# Patient Record
Sex: Female | Born: 1995 | Race: Black or African American | Hispanic: No | Marital: Single | Smoking: Never smoker
Health system: Southern US, Community
[De-identification: ages and names within clinical notes are randomized; demographics above are authoritative.]

## PROBLEM LIST (undated history)

## (undated) DIAGNOSIS — F32A Depression, unspecified: Secondary | ICD-10-CM

## (undated) DIAGNOSIS — F419 Anxiety disorder, unspecified: Secondary | ICD-10-CM

## (undated) HISTORY — PX: WISDOM TOOTH EXTRACTION: SHX21

---
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
## 2017-12-14 ENCOUNTER — Encounter: Payer: Self-pay | Admitting: Podiatry

## 2017-12-14 NOTE — Patient Instructions (Signed)

## 2017-12-20 NOTE — Progress Notes (Signed)
This encounter was created in error - please disregard.

## 2017-12-27 ENCOUNTER — Ambulatory Visit (INDEPENDENT_AMBULATORY_CARE_PROVIDER_SITE_OTHER): Payer: BLUE CROSS/BLUE SHIELD | Admitting: Podiatry

## 2017-12-27 ENCOUNTER — Encounter: Payer: Self-pay | Admitting: Podiatry

## 2017-12-27 ENCOUNTER — Ambulatory Visit (INDEPENDENT_AMBULATORY_CARE_PROVIDER_SITE_OTHER): Payer: BLUE CROSS/BLUE SHIELD

## 2017-12-27 VITALS — BP 108/72 | HR 95 | Resp 16

## 2017-12-27 DIAGNOSIS — M779 Enthesopathy, unspecified: Secondary | ICD-10-CM

## 2017-12-27 DIAGNOSIS — M2010 Hallux valgus (acquired), unspecified foot: Secondary | ICD-10-CM | POA: Diagnosis not present

## 2017-12-27 DIAGNOSIS — M2011 Hallux valgus (acquired), right foot: Secondary | ICD-10-CM | POA: Diagnosis not present

## 2017-12-27 DIAGNOSIS — M2012 Hallux valgus (acquired), left foot: Secondary | ICD-10-CM

## 2017-12-27 NOTE — Patient Instructions (Signed)

## 2017-12-27 NOTE — Progress Notes (Signed)
   Subjective:    Patient ID: Karen Meyers, female    DOB: Nov 30, 1995, 21 y.o.   MRN: [id number]  HPI    Review of Systems  All other systems reviewed and are negative.      Objective:   Physical Exam        Assessment & Plan:

## 2017-12-27 NOTE — Progress Notes (Signed)
Subjective:   Patient ID: Karen Meyers, female   DOB: 22 y.o.   MRN: [id number]   HPI patient presents stating she gets a lot of pain in her feet in general and she had a bunion deformity of her right over left foot that she was concerned about.  Presents with mother and patient does not smoke and likes to be active.  States she is had foot problems for a long time    Review of Systems  All other systems reviewed and are negative.       Objective:  Physical Exam  Constitutional: She appears well-developed and well-nourished.  Cardiovascular: Intact distal pulses.  Pulmonary/Chest: Effort normal.  Musculoskeletal: Normal range of motion.  Neurological: She is alert.  Skin: Skin is warm.  Nursing note and vitals reviewed.   Neurovascular status intact muscle strength is adequate range of motion within normal limits with patient found to have mild structural bunion deformity right with redness around the side moderate depression of the arch with inflammation around the posterior tibial tendon insertion bilateral.  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

## 2018-01-24 ENCOUNTER — Other Ambulatory Visit: Payer: BLUE CROSS/BLUE SHIELD | Admitting: Orthotics

## 2018-06-22 LAB — OB RESULTS CONSOLE RUBELLA ANTIBODY, IGM: Rubella: NON-IMMUNE/NOT IMMUNE

## 2018-06-22 LAB — OB RESULTS CONSOLE HIV ANTIBODY (ROUTINE TESTING): HIV: NONREACTIVE

## 2018-06-22 LAB — OB RESULTS CONSOLE GC/CHLAMYDIA
Chlamydia: NEGATIVE
Gonorrhea: NEGATIVE

## 2018-06-22 LAB — OB RESULTS CONSOLE HEPATITIS B SURFACE ANTIGEN: Hepatitis B Surface Ag: NEGATIVE

## 2018-08-01 NOTE — L&D Delivery Note (Signed)
Delivery Note At 6:55 PM a viable female was delivered via Vaginal, Spontaneous (Presentation: OA).  APGAR: 8, 9; weight  pending Placenta status: routine , .  Cord:  with the following complications: none.  Cord pH: not sent  Anesthesia:   Episiotomy: None Lacerations: Periurethral Suture Repair: 3.0 Est. Blood Loss (mL): 400   It's a girl - "[place]"!! Mom to postpartum.  Baby to Couplet care / Skin to Skin.  Tyson Dense 01/09/2019, 7:36 PM

## 2018-10-29 LAB — OB RESULTS CONSOLE HIV ANTIBODY (ROUTINE TESTING): HIV: NONREACTIVE

## 2018-12-31 LAB — OB RESULTS CONSOLE GBS: GBS: POSITIVE

## 2019-01-08 ENCOUNTER — Other Ambulatory Visit: Payer: Self-pay

## 2019-01-08 ENCOUNTER — Encounter (HOSPITAL_COMMUNITY): Payer: Self-pay

## 2019-01-08 ENCOUNTER — Inpatient Hospital Stay (EMERGENCY_DEPARTMENT_HOSPITAL)
Admission: AD | Admit: 2019-01-08 | Discharge: 2019-01-08 | Disposition: A | Discharge status: Home / Self Care | Payer: Medicaid Other | Source: Home / Self Care | Attending: Obstetrics and Gynecology | Admitting: Obstetrics and Gynecology

## 2019-01-08 DIAGNOSIS — Z3A37 37 weeks gestation of pregnancy: Secondary | ICD-10-CM

## 2019-01-08 DIAGNOSIS — O479 False labor, unspecified: Secondary | ICD-10-CM | POA: Diagnosis not present

## 2019-01-08 DIAGNOSIS — O471 False labor at or after 37 completed weeks of gestation: Secondary | ICD-10-CM | POA: Insufficient documentation

## 2019-01-08 LAB — OB RESULTS CONSOLE GC/CHLAMYDIA
Chlamydia: NEGATIVE
Gonorrhea: NEGATIVE

## 2019-01-08 NOTE — MAU Note (Signed)
I have communicated with Hansel Feinstein, CNM and reviewed vital signs:  Vitals:   01/08/19 1923 01/08/19 2115  BP: 132/71 131/71  Pulse: (!) 101 86  Resp: 19 19  Temp: 98.9 F (37.2 C)     Vaginal exam:  Dilation: 4 Effacement (%): 60 Cervical Position: Middle Station: -3 Presentation: Vertex Exam by:: Esau Grew, RN,   Also reviewed contraction pattern and that non-stress test is reactive.  It has been documented that patient is contracting every 2-5 minutes with no cervical change over 1.5 hours not indicating active labor.  Patient denies any other complaints.  Based on this report provider has given order for discharge.  A discharge order and diagnosis entered by a provider.   Labor discharge instructions reviewed with patient.

## 2019-01-08 NOTE — Discharge Instructions (Signed)

## 2019-01-08 NOTE — MAU Provider Note (Signed)
S: Ms. Karen Meyers is a 23 y.o. G1P0 at [redacted]w[redacted]d  who presents to MAU today for labor evaluation.     Cervical exam by RN:  Dilation: 4 Effacement (%): 60 Cervical Position: Middle Station: -3 Presentation: Vertex Exam by:: Esau Grew, RN  Fetal Monitoring: Baseline: 140 Variability: average Accelerations: present Decelerations: absent Contractions: irregular  MDM Discussed patient with RN. NST reviewed.   A: SIUP at [redacted]w[redacted]d  False labor  P: Discharge home Labor precautions and kick counts included in AVS Patient to follow-up with Dr Julien Girt as scheduled  Patient may return to MAU as needed or when in labor   Seabron Spates, CNM 01/08/2019 9:05 PM

## 2019-01-08 NOTE — MAU Note (Addendum)
CTX every 2 minutes.  No LOF.  Some bloody show.  Was 4.5 cm today in the office.  No complications w/ pregnancy.  + FM.  GBS +

## 2019-01-09 ENCOUNTER — Encounter (HOSPITAL_COMMUNITY): Payer: Self-pay

## 2019-01-09 ENCOUNTER — Other Ambulatory Visit: Payer: Self-pay

## 2019-01-09 ENCOUNTER — Inpatient Hospital Stay (HOSPITAL_COMMUNITY): Payer: Medicaid Other | Admitting: Anesthesiology

## 2019-01-09 ENCOUNTER — Inpatient Hospital Stay (HOSPITAL_COMMUNITY)
Admission: AD | Admit: 2019-01-09 | Discharge: 2019-01-11 | DRG: 807 | Disposition: A | Discharge status: Home / Self Care | Payer: Medicaid Other | Attending: Obstetrics and Gynecology | Admitting: Obstetrics and Gynecology

## 2019-01-09 DIAGNOSIS — O99824 Streptococcus B carrier state complicating childbirth: Secondary | ICD-10-CM | POA: Diagnosis present

## 2019-01-09 DIAGNOSIS — Z3A37 37 weeks gestation of pregnancy: Secondary | ICD-10-CM

## 2019-01-09 DIAGNOSIS — Z1159 Encounter for screening for other viral diseases: Secondary | ICD-10-CM

## 2019-01-09 DIAGNOSIS — O26893 Other specified pregnancy related conditions, third trimester: Secondary | ICD-10-CM | POA: Diagnosis present

## 2019-01-09 LAB — CBC
HCT: 36.3 % (ref 36.0–46.0)
Hemoglobin: 11.6 g/dL — ABNORMAL LOW (ref 12.0–15.0)
MCH: 26.1 pg (ref 26.0–34.0)
MCHC: 32 g/dL (ref 30.0–36.0)
MCV: 81.6 fL (ref 80.0–100.0)
Platelets: 244 10*3/uL (ref 150–400)
RBC: 4.45 MIL/uL (ref 3.87–5.11)
RDW: 13.5 % (ref 11.5–15.5)
WBC: 13.8 10*3/uL — ABNORMAL HIGH (ref 4.0–10.5)
nRBC: 0 % (ref 0.0–0.2)

## 2019-01-09 LAB — TYPE AND SCREEN
ABO/RH(D): B POS
Antibody Screen: NEGATIVE

## 2019-01-09 LAB — SARS CORONAVIRUS 2: SARS Coronavirus 2: NOT DETECTED

## 2019-01-09 MED ORDER — IBUPROFEN 600 MG PO TABS
600.0000 mg | ORAL_TABLET | Freq: Four times a day (QID) | ORAL | Status: DC
  Administered 2019-01-10 – 2019-01-11 (×7): 600 mg via ORAL
  Filled 2019-01-09 (×7): qty 1

## 2019-01-09 MED ORDER — COCONUT OIL OIL: 1.0000 "application " | TOPICAL_OIL | Status: DC | PRN

## 2019-01-09 MED ORDER — CEFAZOLIN SODIUM-DEXTROSE 1-4 GM/50ML-% IV SOLN
1.0000 g | Freq: Three times a day (TID) | INTRAVENOUS | Status: DC
  Filled 2019-01-09: qty 50

## 2019-01-09 MED ORDER — OXYCODONE HCL 5 MG PO TABS: 10.0000 mg | ORAL_TABLET | ORAL | Status: DC | PRN

## 2019-01-09 MED ORDER — PENICILLIN G 3 MILLION UNITS IVPB - SIMPLE MED: 3.0000 10*6.[IU] | INTRAVENOUS | Status: DC

## 2019-01-09 MED ORDER — ZOLPIDEM TARTRATE 5 MG PO TABS: 5.0000 mg | ORAL_TABLET | Freq: Every evening | ORAL | Status: DC | PRN

## 2019-01-09 MED ORDER — OXYTOCIN 40 UNITS IN NORMAL SALINE INFUSION - SIMPLE MED
2.5000 [IU]/h | INTRAVENOUS | Status: DC
  Filled 2019-01-09: qty 1000

## 2019-01-09 MED ORDER — LACTATED RINGERS IV SOLN: 500.0000 mL | Freq: Once | INTRAVENOUS | Status: DC

## 2019-01-09 MED ORDER — PHENYLEPHRINE 40 MCG/ML (10ML) SYRINGE FOR IV PUSH (FOR BLOOD PRESSURE SUPPORT): 80.0000 ug | PREFILLED_SYRINGE | INTRAVENOUS | Status: DC | PRN

## 2019-01-09 MED ORDER — TETANUS-DIPHTH-ACELL PERTUSSIS 5-2.5-18.5 LF-MCG/0.5 IM SUSP: 0.5000 mL | Freq: Once | INTRAMUSCULAR | Status: DC

## 2019-01-09 MED ORDER — WITCH HAZEL-GLYCERIN EX PADS: 1.0000 "application " | MEDICATED_PAD | CUTANEOUS | Status: DC | PRN

## 2019-01-09 MED ORDER — ONDANSETRON HCL 4 MG/2ML IJ SOLN: 4.0000 mg | INTRAMUSCULAR | Status: DC | PRN

## 2019-01-09 MED ORDER — DIBUCAINE (PERIANAL) 1 % EX OINT: 1.0000 "application " | TOPICAL_OINTMENT | CUTANEOUS | Status: DC | PRN

## 2019-01-09 MED ORDER — DIPHENHYDRAMINE HCL 25 MG PO CAPS: 25.0000 mg | ORAL_CAPSULE | Freq: Four times a day (QID) | ORAL | Status: DC | PRN

## 2019-01-09 MED ORDER — BUTORPHANOL TARTRATE 1 MG/ML IJ SOLN
1.0000 mg | Freq: Once | INTRAMUSCULAR | Status: AC
  Administered 2019-01-09: 1 mg via INTRAVENOUS
  Filled 2019-01-09: qty 1

## 2019-01-09 MED ORDER — FLEET ENEMA 7-19 GM/118ML RE ENEM: 1.0000 | ENEMA | RECTAL | Status: DC | PRN

## 2019-01-09 MED ORDER — OXYCODONE-ACETAMINOPHEN 5-325 MG PO TABS: 2.0000 | ORAL_TABLET | ORAL | Status: DC | PRN

## 2019-01-09 MED ORDER — ACETAMINOPHEN 325 MG PO TABS: 650.0000 mg | ORAL_TABLET | ORAL | Status: DC | PRN

## 2019-01-09 MED ORDER — SODIUM CHLORIDE 0.9 % IV SOLN
2.0000 g | Freq: Once | INTRAVENOUS | Status: AC
  Administered 2019-01-09: 2 g via INTRAVENOUS
  Filled 2019-01-09: qty 2000

## 2019-01-09 MED ORDER — FENTANYL-BUPIVACAINE-NACL 0.5-0.125-0.9 MG/250ML-% EP SOLN
12.0000 mL/h | EPIDURAL | Status: DC | PRN
  Filled 2019-01-09: qty 250

## 2019-01-09 MED ORDER — ONDANSETRON HCL 4 MG PO TABS: 4.0000 mg | ORAL_TABLET | ORAL | Status: DC | PRN

## 2019-01-09 MED ORDER — CEFAZOLIN SODIUM-DEXTROSE 2-4 GM/100ML-% IV SOLN
2.0000 g | Freq: Once | INTRAVENOUS | Status: AC
  Administered 2019-01-09: 16:00:00 2 g via INTRAVENOUS
  Filled 2019-01-09: qty 100

## 2019-01-09 MED ORDER — ONDANSETRON HCL 4 MG/2ML IJ SOLN
4.0000 mg | Freq: Four times a day (QID) | INTRAMUSCULAR | Status: DC | PRN
  Administered 2019-01-09: 4 mg via INTRAVENOUS
  Filled 2019-01-09: qty 2

## 2019-01-09 MED ORDER — SODIUM CHLORIDE 0.9 % IV SOLN
5.0000 10*6.[IU] | Freq: Once | INTRAVENOUS | Status: DC
  Filled 2019-01-09: qty 5

## 2019-01-09 MED ORDER — LACTATED RINGERS IV SOLN
INTRAVENOUS | Status: DC
  Administered 2019-01-09 (×3): via INTRAVENOUS

## 2019-01-09 MED ORDER — SIMETHICONE 80 MG PO CHEW: 80.0000 mg | CHEWABLE_TABLET | ORAL | Status: DC | PRN

## 2019-01-09 MED ORDER — EPHEDRINE 5 MG/ML INJ: 10.0000 mg | INTRAVENOUS | Status: DC | PRN

## 2019-01-09 MED ORDER — SOD CITRATE-CITRIC ACID 500-334 MG/5ML PO SOLN: 30.0000 mL | ORAL | Status: DC | PRN

## 2019-01-09 MED ORDER — BENZOCAINE-MENTHOL 20-0.5 % EX AERO: 1.0000 "application " | INHALATION_SPRAY | CUTANEOUS | Status: DC | PRN

## 2019-01-09 MED ORDER — LACTATED RINGERS IV SOLN: 500.0000 mL | INTRAVENOUS | Status: DC | PRN

## 2019-01-09 MED ORDER — OXYTOCIN BOLUS FROM INFUSION
500.0000 mL | Freq: Once | INTRAVENOUS | Status: AC
  Administered 2019-01-09: 500 mL via INTRAVENOUS

## 2019-01-09 MED ORDER — LIDOCAINE HCL (PF) 1 % IJ SOLN: 30.0000 mL | INTRAMUSCULAR | Status: DC | PRN

## 2019-01-09 MED ORDER — PRENATAL MULTIVITAMIN CH
1.0000 | ORAL_TABLET | Freq: Every day | ORAL | Status: DC
  Administered 2019-01-10 – 2019-01-11 (×2): 1 via ORAL
  Filled 2019-01-09 (×2): qty 1

## 2019-01-09 MED ORDER — SENNOSIDES-DOCUSATE SODIUM 8.6-50 MG PO TABS
2.0000 | ORAL_TABLET | ORAL | Status: DC
  Administered 2019-01-10 – 2019-01-11 (×2): 2 via ORAL
  Filled 2019-01-09 (×2): qty 2

## 2019-01-09 MED ORDER — DIPHENHYDRAMINE HCL 50 MG/ML IJ SOLN: 12.5000 mg | INTRAMUSCULAR | Status: DC | PRN

## 2019-01-09 MED ORDER — OXYCODONE HCL 5 MG PO TABS: 5.0000 mg | ORAL_TABLET | ORAL | Status: DC | PRN

## 2019-01-09 MED ORDER — LIDOCAINE HCL (PF) 1 % IJ SOLN
INTRAMUSCULAR | Status: DC | PRN
  Administered 2019-01-09 (×2): 5 mL via EPIDURAL

## 2019-01-09 MED ORDER — OXYCODONE-ACETAMINOPHEN 5-325 MG PO TABS: 1.0000 | ORAL_TABLET | ORAL | Status: DC | PRN

## 2019-01-09 MED ORDER — SODIUM CHLORIDE (PF) 0.9 % IJ SOLN
INTRAMUSCULAR | Status: DC | PRN
  Administered 2019-01-09: 14 mL/h via EPIDURAL

## 2019-01-09 NOTE — H&P (Signed)
Karen Meyers is a 23 y.o. female presenting for UCs. Denies ROM. PNC uncomplicated. OB History    Gravida  1   Para      Term      Preterm      AB      Living        SAB      TAB      Ectopic      Multiple      Live Births             History reviewed. No pertinent past medical history. Past Surgical History:  Procedure Laterality Date  . WISDOM TOOTH EXTRACTION     Family History: family history is not on file. Social History:  reports that she has never smoked. She has never used smokeless tobacco. She reports previous alcohol use. She reports that she does not use drugs.     Maternal Diabetes: No Genetic Screening: Normal Maternal Ultrasounds/Referrals: Normal Fetal Ultrasounds or other Referrals:  None Maternal Substance Abuse:  No Significant Maternal Medications:  None Significant Maternal Lab Results:  None Other Comments:  None  Review of Systems  Eyes: Negative for blurred vision.  Gastrointestinal: Negative for abdominal pain.  Neurological: Negative for headaches.   Maternal Medical History:  Fetal activity: Perceived fetal activity is normal.      Dilation: 7 Effacement (%): 80 Station: -2, -3 Exam by:: Krystal Eaton, RN Blood pressure 118/80, pulse 91, temperature 98.3 F (36.8 C), temperature source Oral, resp. rate 18, height 5\' 5"  (1.651 m), weight 102.1 kg, SpO2 100 %.   Fetal Exam Fetal State Assessment: Category I - tracings are normal.     Physical Exam  Cardiovascular: Normal rate.  Respiratory: Effort normal.    Prenatal labs: ABO, Rh: --/--/B POS (06/10 1520) Antibody: NEG (06/10 1520) Rubella:   RPR:    HBsAg:    HIV: Non-reactive (03/30 0000)  GBS: Positive (06/01 0000)   Assessment/Plan: 23 yo G1P0 in active labor Ampicillin for GBBS prophylaxis ordered Awaiting epidural   Shon Millet II 01/09/2019, 4:24 PM

## 2019-01-09 NOTE — MAU Note (Signed)
Pt presents to MAU with c/o ctx that started yesterday, have increased in intensity. Pt denies VB and LOF. +FM

## 2019-01-09 NOTE — Anesthesia Preprocedure Evaluation (Signed)
Anesthesia Evaluation  Patient identified by MRN, date of birth, ID band Patient awake    Reviewed: Allergy & Precautions, H&P , NPO status , Patient's Chart, lab work & pertinent test results  History of Anesthesia Complications Negative for: history of anesthetic complications  Airway Mallampati: II  TM Distance: >3 FB Neck ROM: full    Dental no notable dental hx. (+) Teeth Intact   Pulmonary neg pulmonary ROS,    Pulmonary exam normal breath sounds clear to auscultation       Cardiovascular negative cardio ROS Normal cardiovascular exam Rhythm:regular Rate:Normal     Neuro/Psych negative neurological ROS  negative psych ROS   GI/Hepatic negative GI ROS, Neg liver ROS,   Endo/Other  negative endocrine ROS  Renal/GU negative Renal ROS  negative genitourinary   Musculoskeletal   Abdominal (+) + obese,   Peds  Hematology negative hematology ROS (+)   Anesthesia Other Findings   Reproductive/Obstetrics (+) Pregnancy                             Anesthesia Physical Anesthesia Plan  ASA: II  Anesthesia Plan: Epidural   Post-op Pain Management:    Induction:   PONV Risk Score and Plan:   Airway Management Planned:   Additional Equipment:   Intra-op Plan:   Post-operative Plan:   Informed Consent: I have reviewed the patients History and Physical, chart, labs and discussed the procedure including the risks, benefits and alternatives for the proposed anesthesia with the patient or authorized representative who has indicated his/her understanding and acceptance.       Plan Discussed with:   Anesthesia Plan Comments:         Anesthesia Quick Evaluation  

## 2019-01-09 NOTE — Progress Notes (Signed)
FHT cat one AROM > clear Cx 9/C/0/vtx

## 2019-01-09 NOTE — Anesthesia Procedure Notes (Signed)
Epidural Patient location during procedure: OB Start time: 01/09/2019 4:41 PM End time: 01/09/2019 4:51 PM  Staffing Anesthesiologist: Murvin Natal, MD Performed: anesthesiologist   Preanesthetic Checklist Completed: patient identified, site marked, pre-op evaluation, timeout performed, IV checked, risks and benefits discussed and monitors and equipment checked  Epidural Patient position: sitting Prep: DuraPrep Patient monitoring: heart rate, cardiac monitor, continuous pulse ox and blood pressure Approach: midline Location: L4-L5 Injection technique: LOR air  Needle:  Needle type: Tuohy  Needle gauge: 17 G Needle length: 9 cm Needle insertion depth: 8 cm Catheter type: closed end flexible Catheter size: 19 Gauge Catheter at skin depth: 13 cm Test dose: negative and Other  Assessment Events: blood not aspirated, injection not painful, no injection resistance and negative IV test  Additional Notes Informed consent obtained prior to proceeding including risk of failure, 1% risk of PDPH, risk of minor discomfort and bruising. Discussed alternatives to epidural analgesia and patient desires to proceed.  Timeout performed pre-procedure verifying patient name, procedure, and platelet count.  Patient tolerated procedure well. Reason for block:procedure for pain

## 2019-01-10 LAB — CBC
HCT: 30.8 % — ABNORMAL LOW (ref 36.0–46.0)
Hemoglobin: 9.9 g/dL — ABNORMAL LOW (ref 12.0–15.0)
MCH: 26.1 pg (ref 26.0–34.0)
MCHC: 32.1 g/dL (ref 30.0–36.0)
MCV: 81.1 fL (ref 80.0–100.0)
Platelets: 209 10*3/uL (ref 150–400)
RBC: 3.8 MIL/uL — ABNORMAL LOW (ref 3.87–5.11)
RDW: 13.6 % (ref 11.5–15.5)
WBC: 15.1 10*3/uL — ABNORMAL HIGH (ref 4.0–10.5)
nRBC: 0 % (ref 0.0–0.2)

## 2019-01-10 LAB — RPR: RPR Ser Ql: NONREACTIVE

## 2019-01-10 LAB — ABO/RH: ABO/RH(D): B POS

## 2019-01-10 NOTE — Lactation Note (Signed)
This note was copied from a baby's chart. Lactation Consultation Note  Patient Name: Karen Meyers [id number] Date: 01/10/2019 Reason for consult: Difficult latch;Follow-up assessment;Early term 37-38.6wks;1st time breastfeeding;NICU baby;Infant < 6lbs  P1 mother whose infant is now 40 hours old.  This is an ETI at 37+1 weeks weighing < 6 lbs.  Mother requested latch assistance.  Mother's breasts are large, soft and non tender and nipples are everted and short shafted.  Mother has not been wearing her breast shells.  Asked her to put her bra on after this feeding and to wear shells.  Mother verbalized understanding.    Mother is familiar with hand expression but was unable to obtain colostrum at this time.  Assisted baby to latch onto the right breast in the football hold but she was not interested in sucking.  Gentle stimulation provided but still no interest.  Mother removed her from the breast and I stimulated her to awaken more.  Second attempt made to latch and baby was able to latch and took a few sucks before stopping.  Removed her from the breast and she fell asleep.  Swaddled baby.  Mother will call for latch assistance at the next feeding if needed.  She will pump with the DEBP and feed back any EBM she obtains.  Father present.   Maternal Data Formula Feeding for Exclusion: Yes Reason for exclusion: Mother's choice to formula and breast feed on admission Has patient been taught Hand Expression?: Yes Does the patient have breastfeeding experience prior to this delivery?: No  Feeding Feeding Type: Breast Milk  LATCH Score Latch: Too sleepy or reluctant, no latch achieved, no sucking elicited.  Audible Swallowing: None  Type of Nipple: Everted at rest and after stimulation(short shafted; reminded to use breast shells)  Comfort (Breast/Nipple): Soft / non-tender  Hold (Positioning): Assistance needed to correctly position infant at breast and maintain latch.  LATCH  Score: 5  Interventions Interventions: Breast feeding basics reviewed;Assisted with latch;Skin to skin;Hand express;Breast compression;Adjust position;Shells;Position options;Support pillows  Lactation Tools Discussed/Used WIC Program: No   Consult Status Consult Status: Follow-up Date: 01/11/19 Follow-up type: In-patient    Little Ishikawa 01/10/2019, 4:13 PM

## 2019-01-10 NOTE — Anesthesia Postprocedure Evaluation (Signed)
Anesthesia Post Note  Patient: Karen Meyers  Procedure(s) Performed: AN AD HOC LABOR EPIDURAL     Patient location during evaluation: Mother Baby Anesthesia Type: Epidural Level of consciousness: awake, awake and alert and oriented Pain management: pain level controlled Vital Signs Assessment: post-procedure vital signs reviewed and stable Respiratory status: spontaneous breathing Cardiovascular status: blood pressure returned to baseline Postop Assessment: no headache, no backache, no apparent nausea or vomiting, adequate PO intake and able to ambulate Anesthetic complications: no    Last Vitals:  Vitals:   01/10/19 0200 01/10/19 0551  BP: 121/66 111/71  Pulse: 89 68  Resp: 18 18  Temp: 36.9 C 36.7 C  SpO2: 100% 100%    Last Pain:  Vitals:   01/10/19 0551  TempSrc: Oral  PainSc: 0-No pain   Pain Goal:                   Kathie Rhodes

## 2019-01-10 NOTE — Progress Notes (Signed)
Post Partum Day 1 Subjective: no complaints, up ad lib, voiding and tolerating PO  Objective: Blood pressure 111/71, pulse 68, temperature 98.1 F (36.7 C), temperature source Oral, resp. rate 18, height 5\' 5"  (1.651 m), weight 102.1 kg, SpO2 100 %, unknown if currently breastfeeding.  Physical Exam:  General: alert, cooperative and appears stated age Lochia: appropriate Uterine Fundus: firm Incision: healing well, no significant drainage, no dehiscence, no significant erythema DVT Evaluation: No evidence of DVT seen on physical exam. Negative Homan's sign. No cords or calf tenderness. No significant calf/ankle edema.  Recent Labs    01/09/19 1527 01/10/19 0509  HGB 11.6* 9.9*  HCT 36.3 30.8*    Assessment/Plan: Plan for discharge tomorrow   LOS: 1 day   Tyson Dense 01/10/2019, 9:41 AM

## 2019-01-10 NOTE — Lactation Note (Signed)
This note was copied from a baby's chart. Lactation Consultation Note Baby 81 hrs old. New mom states having difficulty latching., baby had rather sleep than eat. Newborn behavior, STS, I&O, feeding habits, feeding positioning, breast massage, milk storage, supply and demand discussed. Mom has large breast w/flat nipples. Compressible. Encouraged to stimulate to evert prior to latching. Hand pump given for pre-pumping. Shells given. Hand expression taught. Colostrum poured from mom's breast.  Attempted to latch baby w/o NS. Unable to latch. Fitted #20 NS. Demonstrated application. Baby fussing, mom wasn't properly paying attention to application of NS. Hand expressed 5 ml colostrum.  Inserted into NS w/curve tip syring. Baby latched and fed. Baby getting fussy at the breast. Suggested mom burp baby.  Mom shown how to use DEBP & how to disassemble, clean, & reassemble parts. Mom knows to pump q3h for 15-20 min. Mom wasn't paying undivided attention to pump instructions. Mom may need instructed again.  Encouraged mom to call for questions or assistance. Lactation brochure given. ' Patient Name: Karen Meyers [id number] Date: 01/10/2019 Reason for consult: Initial assessment;Infant < 6lbs;Early term 37-38.6wks;1st time breastfeeding   Maternal Data Has patient been taught Hand Expression?: Yes Does the patient have breastfeeding experience prior to this delivery?: No  Feeding Feeding Type: Breast Fed  LATCH Score Latch: Repeated attempts needed to sustain latch, nipple held in mouth throughout feeding, stimulation needed to elicit sucking reflex.  Audible Swallowing: Spontaneous and intermittent(w/NS)  Type of Nipple: Flat  Comfort (Breast/Nipple): Soft / non-tender  Hold (Positioning): Full assist, staff holds infant at breast  LATCH Score: 6  Interventions Interventions: Breast feeding basics reviewed;Adjust position;DEBP;Assisted with latch;Support pillows;Skin to  skin;Breast massage;Expressed milk;Position options;Hand express;Pre-pump if needed;Shells;Breast compression;Hand pump  Lactation Tools Discussed/Used Tools: Shells;Pump Shell Type: Inverted Breast pump type: Double-Electric Breast Pump;Manual WIC Program: No Pump Review: Setup, frequency, and cleaning;Milk Storage Initiated by:: Allayne Stack RN IBCLC Date initiated:: 01/10/19   Consult Status Consult Status: Follow-up Date: 01/10/19(in pm) Follow-up type: In-patient    Theodoro Kalata 01/10/2019, 3:49 AM

## 2019-01-10 NOTE — Lactation Note (Signed)
This note was copied from a baby's chart. Lactation Consultation Note  Patient Name: Karen Meyers [id number] Date: 01/10/2019 Reason for consult: Follow-up assessment;Difficult latch;Early term 37-38.6wks;1st time breastfeeding;NICU baby  P1 mother whose infant is now 41 hours old.  This is an ETI at 37+1 weeks weighing < 6 lbs.  Mother requested latch assistance.  By the time I was able to arrive mother had finished feeding baby.  Father was holding baby and she was sleeping.  Mother stated she thinks that this was a good feed.  Mother used the NS and saw residual in the NS tip upon completion of feeding.  She had finished approximately 15 minutes prior to my arrival.  When I noticed that mother had not pumped after using the NS I reminded her to immediately pump after every feeding for 15 minutes.  I reviewed the pump set up with her and suction requirements.  #24 flange size is appropriate at this time.  Mother will feed back any EBM she obtains with pumping.  Finger feeding reviewed.  Encouraged to continue feeding 8-12 times/24 hours or sooner if baby shows cues.  She will call for latch assistance as needed.     Maternal Data Formula Feeding for Exclusion: Yes Reason for exclusion: Mother's choice to formula and breast feed on admission Has patient been taught Hand Expression?: Yes Does the patient have breastfeeding experience prior to this delivery?: No  Feeding    LATCH Score Latch: Too sleepy or reluctant, no latch achieved, no sucking elicited.  Audible Swallowing: None  Type of Nipple: Everted at rest and after stimulation(short shafted; reminded to use breast shells)  Comfort (Breast/Nipple): Soft / non-tender  Hold (Positioning): Assistance needed to correctly position infant at breast and maintain latch.  LATCH Score: 5  Interventions Interventions: Breast feeding basics reviewed;Assisted with latch;Skin to skin;Hand express;Breast compression;Adjust  position;Shells;Position options;Support pillows  Lactation Tools Discussed/Used WIC Program: No Pump Review: Setup, frequency, and cleaning;Milk Storage(Reviewed)   Consult Status Consult Status: Follow-up Date: 01/11/19 Follow-up type: In-patient    Little Ishikawa 01/10/2019, 7:35 PM

## 2019-01-11 ENCOUNTER — Ambulatory Visit: Payer: Self-pay

## 2019-01-11 MED ORDER — MEASLES, MUMPS & RUBELLA VAC IJ SOLR
0.5000 mL | Freq: Once | INTRAMUSCULAR | Status: AC
  Administered 2019-01-11: 0.5 mL via SUBCUTANEOUS
  Filled 2019-01-11: qty 0.5

## 2019-01-11 NOTE — Lactation Note (Signed)
This note was copied from a baby's chart. Lactation Consultation Note  Patient Name: Karen Meyers [id number] Date: 01/11/2019 Reason for consult: Follow-up assessment;Primapara;1st time breastfeeding;Infant < 6lbs;Early term 37-38.6wks;Infant weight loss;Other (Comment)(5 % weight loss/ milk is in - see [place] note) Baby is 13 hours old , bilirubin - 10.0 at 34 hours, baby is feeding consistently, voids qs, only 2 stools and 1 smear.  As LC entered the room baby latched with depth, consistent feeding pattern with swallows. LC showed  Mom breast compressions and increased swallows noted. When baby released the nipple was slightly  Slanted even though mom mentioned she was comfortable with feeding.  Breast are warm and filling.  LC reviewed engorgement prevention and tx.  Per mom hasn't pumped with the DEBP. [place] asked her if she need a review of set and she responded no She understood it.  LC discussed the importance of the 1st 2 weeks of breast feeding and protecting her milk supply,  And if the baby is not latching the need to release down her breast so she stays comfortable.  LC recommended if the 1st breast is full , not engorged - breast massage , hand express, pre- pump  So the baby can latch deeper.  Reminded mom due to the baby being less than 6 pounds may only feed the 1st breast, and if not  Softened well needs to pump both breast with DEBP or hand pump. [place] reminded mom if she pumps off  Any milk, hand express, to save milk for next feeding and feed back to baby.  Per mom will have a DEBP [place] at  Home.   LC recommended and discussed mom considering F/U with LC O/P at [facility].  LC encouraged for mom to think about it over night and make the am LC aware to place request in Epic.      Maternal Data Has patient been taught Hand Expression?: Yes(LC reviewed)  Feeding Feeding Type: (baby latched with depth) Nipple Type: Slow - flow  LATCH Score Latch: (latched with depth  on the left breast / cross cradle)  Audible Swallowing: (multiple swallows noted / increased with breast compressions)  Type of Nipple: (nipple well rounded when baby released)  Comfort (Breast/Nipple): (per mom comfortable)  Hold (Positioning): (mom independent with latch)     Interventions Interventions: Breast feeding basics reviewed;Breast compression;Shells;Hand pump;DEBP  Lactation Tools Discussed/Used Tools: Pump;Shells Shell Type: Inverted Breast pump type: Double-Electric Breast Pump;Manual   Consult Status Consult Status: Follow-up Date: 01/12/19 Follow-up type: In-patient    [place] 01/11/2019, 5:09 PM

## 2019-01-11 NOTE — Discharge Summary (Signed)
Obstetric Discharge Summary Reason for Admission: onset of labor Prenatal Procedures: none Intrapartum Procedures: nsvd Postpartum Procedures: none Complications-Operative and Postpartum: none Hemoglobin  Date Value Ref Range Status  01/10/2019 9.9 (L) 12.0 - 15.0 g/dL Final   HCT  Date Value Ref Range Status  01/10/2019 30.8 (L) 36.0 - 46.0 % Final    Physical Exam:  General: alert, cooperative and appears stated age 23: appropriate Uterine Fundus: firm Incision: healing well, no significant drainage, no dehiscence DVT Evaluation: No evidence of DVT seen on physical exam.  Discharge Diagnoses: Term Pregnancy-delivered  Discharge Information: Date: 01/11/2019 Activity: pelvic rest Diet: routine Medications: none Condition: stable Instructions: refer to practice specific booklet Discharge to: home   Newborn Data: Live born female  Birth Weight: 5 lb 11.6 oz (2597 g) APGAR: 8, 9  Newborn Delivery   Birth date/time: 01[birth year]/2019 18:55:00 Delivery type: Vaginal, Spontaneous      Home with mother.  Cyril Mourning 01/11/2019, 10:23 AM

## 2019-01-12 ENCOUNTER — Ambulatory Visit: Payer: Self-pay

## 2019-01-12 NOTE — Lactation Note (Signed)
This note was copied from a baby's chart. Lactation Consultation Note  Patient Name: Karen Meyers [id number] Date: 01/12/2019 Reason for consult: Follow-up assessment;Early term 37-38.6wks;Infant < 6lbs;Primapara Baby is 62 hours/2% weight loss.  Mom is choosing to mostly bottle feed but states baby can latch.  She is pumping with manual pump and last obtained 30 mls.  She does not like electric pump because it hurts.  She plans on purchasing a pump after discharge.  Breasts are soft.  Discussed milk coming to volume and the prevention and treatment of engorgement.  Stressed importance of pumping every 3 hours to establish and maintain her milk supply.  Mom denies questions.  Reviewed outpatient lactation services and encouraged to call prn.  Maternal Data    Feeding Feeding Type: Bottle Fed - Formula Nipple Type: Slow - flow  LATCH Score                   Interventions    Lactation Tools Discussed/Used     Consult Status Consult Status: Complete Follow-up type: Call as needed    Ave Filter 01/12/2019, 9:26 AM

## 2020-06-14 ENCOUNTER — Emergency Department (HOSPITAL_BASED_OUTPATIENT_CLINIC_OR_DEPARTMENT_OTHER): Payer: BC Managed Care – PPO

## 2020-06-14 ENCOUNTER — Encounter (HOSPITAL_BASED_OUTPATIENT_CLINIC_OR_DEPARTMENT_OTHER): Payer: Self-pay | Admitting: Emergency Medicine

## 2020-06-14 ENCOUNTER — Emergency Department (HOSPITAL_BASED_OUTPATIENT_CLINIC_OR_DEPARTMENT_OTHER)
Admission: EM | Admit: 2020-06-14 | Discharge: 2020-06-14 | Disposition: A | Discharge status: Home / Self Care | Payer: BC Managed Care – PPO | Attending: Emergency Medicine | Admitting: Emergency Medicine

## 2020-06-14 ENCOUNTER — Other Ambulatory Visit: Payer: Self-pay

## 2020-06-14 DIAGNOSIS — R102 Pelvic and perineal pain: Secondary | ICD-10-CM | POA: Diagnosis not present

## 2020-06-14 HISTORY — DX: Anxiety disorder, unspecified: F41.9

## 2020-06-14 HISTORY — DX: Depression, unspecified: F32.A

## 2020-06-14 LAB — URINALYSIS, MICROSCOPIC (REFLEX): RBC / HPF: >50 RBC/hpf (ref 0–5)

## 2020-06-14 LAB — URINALYSIS, ROUTINE W REFLEX MICROSCOPIC
Bilirubin Urine: NEGATIVE
Glucose, UA: NEGATIVE mg/dL
Ketones, ur: NEGATIVE mg/dL
Nitrite: NEGATIVE
Protein, ur: NEGATIVE mg/dL
Specific Gravity, Urine: 1.015 (ref 1.005–1.030)
pH: 7.5 (ref 5.0–8.0)

## 2020-06-14 LAB — PREGNANCY, URINE: Preg Test, Ur: NEGATIVE

## 2020-06-14 NOTE — ED Triage Notes (Addendum)
States,' I went to go pee and then I started having really, bad pelvic pain" having vag bleeding, she removed Nuvo ring  x 2 days . Has used 4 pads in past 24hrs

## 2020-06-14 NOTE — ED Notes (Signed)
In to round on pt and mother, pt still in radiology

## 2020-06-14 NOTE — ED Notes (Signed)
Pt remains in radiology 

## 2020-06-14 NOTE — ED Provider Notes (Signed)
[facility]GENCY [facility]PT [facility] Emergency Department Provider Note MRN:  [id number]  Arrival date & time: 06/14/20     Chief Complaint   Pelvic Pain   History of Present Illness   Karen Meyers is a 24 y.o. year-old female with no pertinent past medical history presenting to the ED with chief complaint of pelvic pain.  Sudden onset severe pelvic pain and pressure at 4 PM today, lasting 5 to 10 minutes and then slowly improving, currently mild in severity.  Explains that ever since having her first child 17 months ago, she seems to have a severe pain at about the time that she ovulates.  About 3 or 4 weeks ago, she inserted a NuvaRing for the first time.  She has been having some light vaginal bleeding for the past 3 to 4 days.  The pain feels like a severe pressure similar to childbirth.  Denies fever, no other complaints.  Review of Systems  A complete 10 system review of systems was obtained and all systems are negative except as noted in the HPI and PMH.   Patient's Health History    Past Medical History:  Diagnosis Date   Anxiety    Depression     Past Surgical History:  Procedure Laterality Date   WISDOM TOOTH EXTRACTION      No family history on file.  Social History   Socioeconomic History   Marital status: Single    Spouse name: Not on file   Number of children: Not on file   Years of education: Not on file   Highest education level: Not on file  Occupational History   Not on file  Tobacco Use   Smoking status: Never Smoker   Smokeless tobacco: Never Used  Vaping Use   Vaping Use: Never used  Substance and Sexual Activity   Alcohol use: Not Currently   Drug use: Never   Sexual activity: Yes    Birth control/protection: None  Other Topics Concern   Not on file  Social History Narrative   Not on file   Social Determinants of Health   Financial Resource Strain:    Difficulty of Paying Living Expenses: Not on file  Food  Insecurity:    Worried About Running Out of Food in the Last Year: Not on file   Ran Out of Food in the Last Year: Not on file  Transportation Needs:    Lack of Transportation (Medical): Not on file   Lack of Transportation (Non-Medical): Not on file  Physical Activity:    Days of Exercise per Week: Not on file   Minutes of Exercise per Session: Not on file  Stress:    Feeling of Stress : Not on file  Social Connections:    Frequency of Communication with Friends and Family: Not on file   Frequency of Social Gatherings with Friends and Family: Not on file   Attends Religious Services: Not on file   Active Member of Clubs or Organizations: Not on file   Attends Banker Meetings: Not on file   Marital Status: Not on file  Intimate Partner Violence:    Fear of Current or Ex-Partner: Not on file   Emotionally Abused: Not on file   Physically Abused: Not on file   Sexually Abused: Not on file     Physical Exam   Vitals:   06/14/20 1720  BP: 135/75  Pulse: 91  Resp: 19  Temp: 98.3 F (36.8 C)  SpO2: 100%  CONSTITUTIONAL: Well-appearing, NAD NEURO:  Alert and oriented x 3, no focal deficits EYES:  eyes equal and reactive ENT/NECK:  no LAD, no JVD CARDIO: Regular rate, well-perfused, normal S1 and S2 PULM:  CTAB no wheezing or rhonchi GI/GU:  normal bowel sounds, non-distended, non-tender MSK/SPINE:  No gross deformities, no edema SKIN:  no rash, atraumatic PSYCH:  Appropriate speech and behavior  *Additional and/or pertinent findings included in MDM below  Diagnostic and Interventional Summary    EKG Interpretation  Date/Time:    Ventricular Rate:    PR Interval:    QRS Duration:   QT Interval:    QTC Calculation:   R Axis:     Text Interpretation:        Labs Reviewed  URINALYSIS, ROUTINE W REFLEX MICROSCOPIC - Abnormal; Notable for the following components:      Result Value   APPearance CLOUDY (*)    Hgb urine dipstick  LARGE (*)    Leukocytes,Ua TRACE (*)    All other components within normal limits  URINALYSIS, MICROSCOPIC (REFLEX) - Abnormal; Notable for the following components:   Bacteria, UA MANY (*)    All other components within normal limits  PREGNANCY, URINE    US PELVIC COMPLETE W TRANSVAGINAL AND TORSION R/O    (Results Pending)    Medications - No data to display   Procedures  /  Critical Care Procedures  ED Course and Medical Decision Making  I have reviewed the triage vital signs, the nursing notes, and pertinent available records from the EMR.  Listed above are laboratory and imaging tests that I personally ordered, reviewed, and interpreted and then considered in my medical decision making (see below for details).  Sudden brief pelvic pain in this 24 year old female, seems to be describing more recent mittelschmerz which could possibly explain her symptoms today though the timing does not quite align with her ovulation according to her, though this is complicated by the use of NuvaRing recently.  Overall patient is well-appearing with normal vital signs, completely benign abdomen.  She has discussed this with her gynecologist, who said that they would obtain an ultrasound if it "happened again" given her return of pain today will obtain ultrasound to exclude torsion or large cyst or structural process.  She has close follow-up with OB and can likely be discharged thereafter.  Pelvic exam offered but patient prefers to hold off until she can be seen by her obstetrician/gynecologist.       Elmer Sow. Pilar Plate, MD [facility] Emergency Medicine [facility] [email or web address]  Final Clinical Impressions(s) / ED Diagnoses     ICD-10-CM   1. Pelvic pain in female  R10.2 US PELVIC COMPLETE W TRANSVAGINAL AND TORSION R/O    US PELVIC COMPLETE W TRANSVAGINAL AND TORSION R/O    ED Discharge Orders    None       Discharge Instructions Discussed with and Provided to  Patient:   Discharge Instructions   None       Sabas Sous, MD 06/14/20 2018

## 2020-06-14 NOTE — Discharge Instructions (Addendum)
You were evaluated in the Emergency Department and after careful evaluation, we did not find any emergent condition requiring admission or further testing in the hospital.  Your exam/testing today is overall reassuring.  Ultrasound with no abnormalities or emergencies.  We recommend follow-up with your OB/GYN for continued management.  Please return to the Emergency Department if you experience any worsening of your condition.   Thank you for allowing [place] to be a part of your care.

## 2020-08-25 ENCOUNTER — Other Ambulatory Visit: Payer: Self-pay

## 2020-08-25 ENCOUNTER — Encounter (HOSPITAL_BASED_OUTPATIENT_CLINIC_OR_DEPARTMENT_OTHER): Payer: Self-pay | Admitting: Emergency Medicine

## 2020-08-25 DIAGNOSIS — Z5321 Procedure and treatment not carried out due to patient leaving prior to being seen by health care provider: Secondary | ICD-10-CM | POA: Diagnosis not present

## 2020-08-25 DIAGNOSIS — F419 Anxiety disorder, unspecified: Secondary | ICD-10-CM | POA: Insufficient documentation

## 2020-08-25 LAB — RAPID URINE DRUG SCREEN, HOSP PERFORMED
Amphetamines: NOT DETECTED
Barbiturates: NOT DETECTED
Benzodiazepines: NOT DETECTED
Cocaine: NOT DETECTED
Opiates: NOT DETECTED
Tetrahydrocannabinol: NOT DETECTED

## 2020-08-25 LAB — PREGNANCY, URINE: Preg Test, Ur: NEGATIVE

## 2020-08-25 NOTE — ED Triage Notes (Signed)
Pt reports being prescribed Wellbutrin, took for months, then stopped taking for two months because she "felt better", restarted taking 5 days without consulting with PCP; now reporting severe anxiety; denies SI/HI at this time

## 2020-08-26 ENCOUNTER — Emergency Department (HOSPITAL_BASED_OUTPATIENT_CLINIC_OR_DEPARTMENT_OTHER)
Admission: EM | Admit: 2020-08-26 | Discharge: 2020-08-26 | Disposition: A | Discharge status: Home / Self Care | Payer: BC Managed Care – PPO | Attending: Emergency Medicine | Admitting: Emergency Medicine

## 2021-02-05 LAB — OB RESULTS CONSOLE ABO/RH: RH Type: POSITIVE

## 2021-02-05 LAB — OB RESULTS CONSOLE RPR: RPR: NONREACTIVE

## 2021-02-05 LAB — OB RESULTS CONSOLE RUBELLA ANTIBODY, IGM: Rubella: IMMUNE

## 2021-02-05 LAB — OB RESULTS CONSOLE HEPATITIS B SURFACE ANTIGEN: Hepatitis B Surface Ag: NEGATIVE

## 2021-06-06 ENCOUNTER — Encounter (HOSPITAL_COMMUNITY): Payer: Self-pay | Admitting: Obstetrics and Gynecology

## 2021-06-06 ENCOUNTER — Inpatient Hospital Stay (HOSPITAL_COMMUNITY)
Admission: AD | Admit: 2021-06-06 | Discharge: 2021-06-07 | Disposition: A | Discharge status: Home / Self Care | Payer: BC Managed Care – PPO | Source: Ambulatory Visit | Attending: Obstetrics and Gynecology | Admitting: Obstetrics and Gynecology

## 2021-06-06 ENCOUNTER — Other Ambulatory Visit: Payer: Self-pay

## 2021-06-06 DIAGNOSIS — Z3A25 25 weeks gestation of pregnancy: Secondary | ICD-10-CM | POA: Diagnosis not present

## 2021-06-06 DIAGNOSIS — O4702 False labor before 37 completed weeks of gestation, second trimester: Secondary | ICD-10-CM | POA: Diagnosis not present

## 2021-06-06 DIAGNOSIS — Z3689 Encounter for other specified antenatal screening: Secondary | ICD-10-CM

## 2021-06-06 DIAGNOSIS — O4700 False labor before 37 completed weeks of gestation, unspecified trimester: Secondary | ICD-10-CM

## 2021-06-06 LAB — URINALYSIS, ROUTINE W REFLEX MICROSCOPIC
Bilirubin Urine: NEGATIVE
Glucose, UA: NEGATIVE mg/dL
Hgb urine dipstick: NEGATIVE
Ketones, ur: 5 mg/dL — AB
Nitrite: NEGATIVE
Protein, ur: NEGATIVE mg/dL
Specific Gravity, Urine: 1.012 (ref 1.005–1.030)
pH: 6 (ref 5.0–8.0)

## 2021-06-06 MED ORDER — LACTATED RINGERS IV BOLUS
1000.0000 mL | Freq: Once | INTRAVENOUS | Status: AC
  Administered 2021-06-06: 1000 mL via INTRAVENOUS

## 2021-06-06 NOTE — MAU Note (Signed)
Pt reports contractions x 3 hours, some pressure. Denies bleeding or ROM. Reports positive fetal movement

## 2021-06-07 DIAGNOSIS — O4702 False labor before 37 completed weeks of gestation, second trimester: Secondary | ICD-10-CM

## 2021-06-07 DIAGNOSIS — Z3A25 25 weeks gestation of pregnancy: Secondary | ICD-10-CM

## 2021-06-07 MED ORDER — LACTATED RINGERS IV BOLUS: 1000.0000 mL | Freq: Once | INTRAVENOUS | Status: DC

## 2021-06-07 NOTE — MAU Provider Note (Signed)
History     CSN: [id number]  Arrival date and time: 06/06/21 2159   Event Date/Time   First Provider Initiated Contact with Patient 06/06/21 2253      Chief Complaint  Patient presents with   Contractions   Karen Meyers is a 25 y.o. G2P1001 at [redacted]w[redacted]d who presents to MAU for contractions. Patient reports contractions have been going on for 3 hours prior to her arrival in MAU. Patient states they started immediately after intercourse that she had today. Patient states the contractions were intermittently better and worse during this time, but states she came to MAU because sex is what started her previous labor and because she thought the contractions would have gone away by now. Patient reports placental abnormality. Prior to examining the patient, called and spoke with Dr. Lorane Gell who reports patient has circumvallate placenta and does not have any evidence of placenta previa. Patient denies any vaginal bleeding or ROM and reports normal FM.  Pt denies VB, LOF, ctx, decreased FM, vaginal discharge/odor/itching. Pt denies N/V, abdominal pain, constipation, diarrhea, or urinary problems. Pt denies fever, chills, fatigue, sweating or changes in appetite. Pt denies SOB or chest pain. Pt denies dizziness, HA, light-headedness, weakness.  Problems this pregnancy include: circumvallate placenta. Allergies? NKDA Current medications/supplements? none Prenatal care provider? [facility]   OB History     Gravida  2   Para  1   Term  1   Preterm      AB      Living  1      SAB      IAB      Ectopic      Multiple  0   Live Births  1           Past Medical History:  Diagnosis Date   Anxiety    Depression     Past Surgical History:  Procedure Laterality Date   WISDOM TOOTH EXTRACTION      No family history on file.  Social History   Tobacco Use   Smoking status: Never   Smokeless tobacco: Never  Vaping Use   Vaping Use: Never used   Substance Use Topics   Alcohol use: Not Currently    Comment: occ   Drug use: Never    Allergies: No Known Allergies  Medications Prior to Admission  Medication Sig Dispense Refill Last Dose   buPROPion (WELLBUTRIN XL) 300 MG 24 hr tablet Take 300 mg by mouth at bedtime.   Past Week    Review of Systems  Constitutional:  Negative for chills, diaphoresis, fatigue and fever.  Eyes:  Negative for visual disturbance.  Respiratory:  Negative for shortness of breath.   Cardiovascular:  Negative for chest pain.  Gastrointestinal:  Negative for abdominal pain, constipation, diarrhea, nausea and vomiting.  Genitourinary:  Negative for dysuria, flank pain, frequency, pelvic pain, urgency, vaginal bleeding and vaginal discharge.       Contractions  Neurological:  Negative for dizziness, weakness, light-headedness and headaches.   Physical Exam   Blood pressure (!) 116/59, pulse 91, temperature 98.3 F (36.8 C), temperature source Oral, resp. rate 17, height 5\' 5"  (1.651 m), weight 101.2 kg, SpO2 99 %, unknown if currently breastfeeding.  Patient Vitals for the past 24 hrs:  BP Temp Temp src Pulse Resp SpO2 Height Weight  06/06/21 2215 (!) 116/59 98.3 F (36.8 C) Oral 91 17 99 % 5\' 5"  (1.651 m) 101.2 kg   Physical Exam Vitals and  nursing note reviewed. Exam conducted with a chaperone present.  Constitutional:      General: She is not in acute distress.    Appearance: Normal appearance. She is not ill-appearing, toxic-appearing or diaphoretic.  HENT:     Head: Normocephalic and atraumatic.  Pulmonary:     Effort: Pulmonary effort is normal.  Abdominal:     Palpations: Abdomen is soft.  Genitourinary:    General: Normal vulva.     Labia:        Right: No rash, tenderness or lesion.        Left: No rash, tenderness or lesion.   Skin:    General: Skin is warm and dry.  Neurological:     Mental Status: She is alert and oriented to person, place, and time.  Psychiatric:         Mood and Affect: Mood normal.        Behavior: Behavior normal.        Thought Content: Thought content normal.        Judgment: Judgment normal.   Results for orders placed or performed during the hospital encounter of 06/06/21 (from the past 24 hour(s))  Urinalysis, Routine w reflex microscopic Urine, Clean Catch     Status: Abnormal   Collection Time: 06/06/21 10:34 PM  Result Value Ref Range   Color, Urine YELLOW YELLOW   APPearance CLEAR CLEAR   Specific Gravity, Urine 1.012 1.005 - 1.030   pH 6.0 5.0 - 8.0   Glucose, UA NEGATIVE NEGATIVE mg/dL   Hgb urine dipstick NEGATIVE NEGATIVE   Bilirubin Urine NEGATIVE NEGATIVE   Ketones, ur 5 (A) NEGATIVE mg/dL   Protein, ur NEGATIVE NEGATIVE mg/dL   Nitrite NEGATIVE NEGATIVE   Leukocytes,Ua MODERATE (A) NEGATIVE   RBC / HPF 0-5 0 - 5 RBC/hpf   WBC, UA 6-10 0 - 5 WBC/hpf   Bacteria, UA RARE (A) NONE SEEN   Squamous Epithelial / LPF 0-5 0 - 5   Mucus PRESENT    No results found.  MAU Course  Procedures  MDM -contractions precipitated by intercourse -no fFN done as result of recent intercourse -per pt report, contractions started resolving prior to administration of fluids -after administration of fluids, oral fluids, and emptying bladder, patient reports contractions have stopped -initial cervical exam posterior/1/thick, unchanged after 3 hours -d/t precipitated contractions not resulting in cervical dilation, no history of PTD, BMZ was held at this time -EFM: reactive       -baseline: 140       -variability: moderate       -accels: present, 15x15       -decels: absent       -TOCO: ctx initial q10min, then spaced out and resolved -pt discharged to home in stable condition  Orders Placed This Encounter  Procedures   Urinalysis, Routine w reflex microscopic Urine, Clean Catch    Standing Status:   Standing    Number of Occurrences:   1   CBC with Differential    Standing Status:   Standing    Number of Occurrences:   1    Comprehensive metabolic panel    Standing Status:   Standing    Number of Occurrences:   1   Insert peripheral IV    Standing Status:   Standing    Number of Occurrences:   1   Discharge patient    Order Specific Question:   Discharge disposition    Answer:   01-Home or Self  Care [1]    Order Specific Question:   Discharge patient date    Answer:   06/07/2021   Meds ordered this encounter  Medications   lactated ringers bolus 1,000 mL   lactated ringers bolus 1,000 mL   Assessment and Plan   1. Preterm contractions   2. [redacted] weeks gestation of pregnancy   3. NST (non-stress test) reactive    Allergies as of 06/07/2021   No Known Allergies      Medication List     TAKE these medications    buPROPion 300 MG 24 hr tablet Commonly known as: WELLBUTRIN XL Take 300 mg by mouth at bedtime.       -advised refraining from intercourse or using condoms -discussed hydration throughout the day and for relief of contractions -discussed s/sx of PTL -return MAU precautions given -pt discharged to home in stable condition  Karen Meyers 06/07/2021, 1:57 AM

## 2021-06-16 LAB — OB RESULTS CONSOLE HIV ANTIBODY (ROUTINE TESTING): HIV: NONREACTIVE

## 2021-07-29 ENCOUNTER — Other Ambulatory Visit: Payer: Self-pay

## 2021-07-29 ENCOUNTER — Inpatient Hospital Stay (HOSPITAL_COMMUNITY): Payer: BC Managed Care – PPO

## 2021-07-29 ENCOUNTER — Inpatient Hospital Stay (HOSPITAL_COMMUNITY)
Admission: AD | Admit: 2021-07-29 | Discharge: 2021-07-29 | Disposition: A | Discharge status: Home / Self Care | Payer: BC Managed Care – PPO | Attending: Obstetrics & Gynecology | Admitting: Obstetrics & Gynecology

## 2021-07-29 ENCOUNTER — Encounter (HOSPITAL_COMMUNITY): Payer: Self-pay | Admitting: Obstetrics & Gynecology

## 2021-07-29 DIAGNOSIS — O99019 Anemia complicating pregnancy, unspecified trimester: Secondary | ICD-10-CM

## 2021-07-29 DIAGNOSIS — O26893 Other specified pregnancy related conditions, third trimester: Secondary | ICD-10-CM | POA: Diagnosis present

## 2021-07-29 DIAGNOSIS — M25519 Pain in unspecified shoulder: Secondary | ICD-10-CM | POA: Insufficient documentation

## 2021-07-29 DIAGNOSIS — M549 Dorsalgia, unspecified: Secondary | ICD-10-CM | POA: Diagnosis not present

## 2021-07-29 DIAGNOSIS — O2693 Pregnancy related conditions, unspecified, third trimester: Secondary | ICD-10-CM

## 2021-07-29 DIAGNOSIS — R0602 Shortness of breath: Secondary | ICD-10-CM | POA: Insufficient documentation

## 2021-07-29 DIAGNOSIS — D649 Anemia, unspecified: Secondary | ICD-10-CM | POA: Insufficient documentation

## 2021-07-29 DIAGNOSIS — O99013 Anemia complicating pregnancy, third trimester: Secondary | ICD-10-CM | POA: Diagnosis not present

## 2021-07-29 DIAGNOSIS — R0781 Pleurodynia: Secondary | ICD-10-CM | POA: Diagnosis not present

## 2021-07-29 DIAGNOSIS — Z3A33 33 weeks gestation of pregnancy: Secondary | ICD-10-CM

## 2021-07-29 DIAGNOSIS — Z3689 Encounter for other specified antenatal screening: Secondary | ICD-10-CM | POA: Insufficient documentation

## 2021-07-29 LAB — URINALYSIS, ROUTINE W REFLEX MICROSCOPIC
Bilirubin Urine: NEGATIVE
Glucose, UA: NEGATIVE mg/dL
Hgb urine dipstick: NEGATIVE
Ketones, ur: NEGATIVE mg/dL
Nitrite: NEGATIVE
Protein, ur: NEGATIVE mg/dL
Specific Gravity, Urine: 1.011 (ref 1.005–1.030)
pH: 6 (ref 5.0–8.0)

## 2021-07-29 LAB — CBC WITH DIFFERENTIAL/PLATELET
Abs Immature Granulocytes: 0.12 10*3/uL — ABNORMAL HIGH (ref 0.00–0.07)
Basophils Absolute: 0 10*3/uL (ref 0.0–0.1)
Basophils Relative: 0 %
Eosinophils Absolute: 0 10*3/uL (ref 0.0–0.5)
Eosinophils Relative: 0 %
HCT: 28.4 % — ABNORMAL LOW (ref 36.0–46.0)
Hemoglobin: 8.9 g/dL — ABNORMAL LOW (ref 12.0–15.0)
Immature Granulocytes: 1 %
Lymphocytes Relative: 14 %
Lymphs Abs: 1.6 10*3/uL (ref 0.7–4.0)
MCH: 23.7 pg — ABNORMAL LOW (ref 26.0–34.0)
MCHC: 31.3 g/dL (ref 30.0–36.0)
MCV: 75.5 fL — ABNORMAL LOW (ref 80.0–100.0)
Monocytes Absolute: 1 10*3/uL (ref 0.1–1.0)
Monocytes Relative: 8 %
Neutro Abs: 8.9 10*3/uL — ABNORMAL HIGH (ref 1.7–7.7)
Neutrophils Relative %: 77 %
Platelets: 240 10*3/uL (ref 150–400)
RBC: 3.76 MIL/uL — ABNORMAL LOW (ref 3.87–5.11)
RDW: 14.4 % (ref 11.5–15.5)
WBC: 11.7 10*3/uL — ABNORMAL HIGH (ref 4.0–10.5)
nRBC: 0 % (ref 0.0–0.2)

## 2021-07-29 LAB — COMPREHENSIVE METABOLIC PANEL
ALT: 11 U/L (ref 0–44)
AST: 14 U/L — ABNORMAL LOW (ref 15–41)
Albumin: 2.6 g/dL — ABNORMAL LOW (ref 3.5–5.0)
Alkaline Phosphatase: 93 U/L (ref 38–126)
Anion gap: 7 (ref 5–15)
BUN: <5 mg/dL — ABNORMAL LOW (ref 6–20)
CO2: 23 mmol/L (ref 22–32)
Calcium: 8.7 mg/dL — ABNORMAL LOW (ref 8.9–10.3)
Chloride: 107 mmol/L (ref 98–111)
Creatinine, Ser: 0.63 mg/dL (ref 0.44–1.00)
GFR, Estimated: >60 mL/min (ref >60)
Glucose, Bld: 94 mg/dL (ref 70–99)
Potassium: 3.3 mmol/L — ABNORMAL LOW (ref 3.5–5.1)
Sodium: 137 mmol/L (ref 135–145)
Total Bilirubin: 0.6 mg/dL (ref 0.3–1.2)
Total Protein: 5.9 g/dL — ABNORMAL LOW (ref 6.5–8.1)

## 2021-07-29 LAB — D-DIMER, QUANTITATIVE: D-Dimer, Quant: 1.54 ug/mL-FEU — ABNORMAL HIGH (ref 0.00–0.50)

## 2021-07-29 MED ORDER — ACETAMINOPHEN 500 MG PO TABS
1000.0000 mg | ORAL_TABLET | Freq: Once | ORAL | Status: AC
  Administered 2021-07-29: 11:00:00 1000 mg via ORAL
  Filled 2021-07-29: qty 2

## 2021-07-29 MED ORDER — CYCLOBENZAPRINE HCL 10 MG PO TABS: 10.0000 mg | ORAL_TABLET | Freq: Two times a day (BID) | ORAL | 0 refills | Status: AC | PRN

## 2021-07-29 MED ORDER — SODIUM CHLORIDE 0.9 % IV SOLN
510.0000 mg | Freq: Once | INTRAVENOUS | Status: AC
  Administered 2021-07-29: 14:00:00 510 mg via INTRAVENOUS
  Filled 2021-07-29: qty 17

## 2021-07-29 MED ORDER — CYCLOBENZAPRINE HCL 5 MG PO TABS
5.0000 mg | ORAL_TABLET | Freq: Once | ORAL | Status: AC
  Administered 2021-07-29: 11:00:00 5 mg via ORAL
  Filled 2021-07-29: qty 1

## 2021-07-29 MED ORDER — SODIUM CHLORIDE 0.9 % IV SOLN: INTRAVENOUS | Status: DC

## 2021-07-29 MED ORDER — IOHEXOL 350 MG/ML SOLN
60.0000 mL | Freq: Once | INTRAVENOUS | Status: AC | PRN
  Administered 2021-07-29: 12:00:00 60 mL via INTRAVENOUS

## 2021-07-29 NOTE — MAU Provider Note (Signed)
History     CSN: [id number]  Arrival date and time: 07/29/21 4818   Event Date/Time   First Provider Initiated Contact with Patient 07/29/21 1032      Chief Complaint  Patient presents with   Back Pain   Shoulder Pain   Shortness of Breath   HPI Karen Meyers is a 25 y.o. G2P1001 at 104w1d who presents to MAU for rib pain. Patient reports constant, sharp pain in her left rib that started at 8pm. Pain radiates into upper back, shoulder and down her left arm. She denies any trauma or pulled muscle. She has not tried anything to relieve pain. No chest pain or palpitations, but has intermittent SOB. SOB is worse when she has braxton hicks contractions. Of note, patient does endorse a history of heart rate "skipping beats". She denies cough, congestion, fever or any known sick contacts. No abdominal/pelvic pain, vaginal bleeding, leaking fluid, or urinary s/s. Endorses active fetal movement.   OB History     Gravida  2   Para  1   Term  1   Preterm      AB      Living  1      SAB      IAB      Ectopic      Multiple  0   Live Births  1           Past Medical History:  Diagnosis Date   Anxiety    Depression     Past Surgical History:  Procedure Laterality Date   WISDOM TOOTH EXTRACTION      History reviewed. No pertinent family history.  Social History   Tobacco Use   Smoking status: Never   Smokeless tobacco: Never  Vaping Use   Vaping Use: Never used  Substance Use Topics   Alcohol use: Not Currently    Comment: occ   Drug use: Never    Allergies: No Known Allergies  No medications prior to admission.    Review of Systems  Constitutional: Negative.   HENT: Negative.    Respiratory:  Positive for shortness of breath. Negative for cough and chest tightness.   Cardiovascular: Negative.   Gastrointestinal: Negative.   Genitourinary: Negative.   Musculoskeletal:  Positive for back pain.       Rib pain, shoulder pain  Neurological:  Negative.    Physical Exam   Patient Vitals for the past 24 hrs:  BP Temp Temp src Pulse Resp SpO2  07/29/21 1454 108/64 -- -- 93 18 100 %  07/29/21 1415 108/71 98.5 F (36.9 C) Oral 95 17 100 %  07/29/21 1410 -- -- -- -- -- 100 %  07/29/21 1406 -- -- -- -- -- 100 %  07/29/21 1405 -- -- -- -- -- 100 %  07/29/21 1400 -- -- -- -- -- 100 %  07/29/21 1358 114/65 -- -- 92 -- --  07/29/21 1357 114/65 98.3 F (36.8 C) -- 90 15 100 %  07/29/21 0952 121/68 98.6 F (37 C) Oral (!) 103 16 100 %    Physical Exam Vitals and nursing note reviewed.  Constitutional:      General: She is not in acute distress.    Appearance: She is well-developed.  Eyes:     Extraocular Movements: Extraocular movements intact.     Pupils: Pupils are equal, round, and reactive to light.  Cardiovascular:     Rate and Rhythm: Regular rhythm. Tachycardia present.     Heart  sounds: No murmur heard. Pulmonary:     Effort: Pulmonary effort is normal. No respiratory distress.     Breath sounds: Normal breath sounds. No decreased breath sounds or wheezing.  Chest:     Chest wall: No tenderness.  Abdominal:     Palpations: Abdomen is soft.     Tenderness: There is no abdominal tenderness. There is no guarding.  Musculoskeletal:        General: Normal range of motion.     Cervical back: Normal range of motion.  Skin:    General: Skin is warm and dry.  Neurological:     General: No focal deficit present.     Mental Status: She is alert and oriented to person, place, and time.  Psychiatric:        Mood and Affect: Mood normal.        Behavior: Behavior normal.    NST FHR: 130 bpm, moderate variability, +15x15 accels, no decels Toco: occasional uc  CT Angio Chest PE W and/or Wo Contrast  Result Date: 07/29/2021 CLINICAL DATA:  Shortness of breath, back pain and shoulder pain. Currently approximately 32-[redacted] weeks pregnant. EXAM: CT ANGIOGRAPHY CHEST WITH CONTRAST TECHNIQUE: Multidetector CT imaging of the  chest was performed using the standard protocol during bolus administration of intravenous contrast. Multiplanar CT image reconstructions and MIPs were obtained to evaluate the vascular anatomy. CONTRAST:  6mL OMNIPAQUE IOHEXOL 350 MG/ML SOLN COMPARISON:  None. FINDINGS: Cardiovascular: Pulmonary arteries are very well opacified to the subsegmental level. There is no evidence of pulmonary embolism. Central pulmonary arteries are normal in caliber. The thoracic aorta is also fairly well opacified and demonstrates normal caliber without evidence of dissection, atherosclerosis or aneurysmal disease. Bovine branching anatomy of the proximal great vessels with low proximal origin of the left vertebral artery off of the proximal left subclavian artery. The heart size is normal. Trace pericardial fluid at the anterior base of the heart which does not appear significant. No calcified coronary artery plaque visualized. Mediastinum/Nodes: No enlarged mediastinal, hilar, or axillary lymph nodes. Thyroid gland, trachea, and esophagus demonstrate no significant findings. Lungs/Pleura: There is no evidence of pulmonary edema, consolidation, pneumothorax, nodule or pleural fluid. Upper Abdomen: No acute abnormality. Musculoskeletal: No chest wall abnormality. No acute or significant osseous findings. Review of the MIP images confirms the above findings. IMPRESSION: Normal CTA of the chest. No evidence of pulmonary embolism or other acute findings. Trace amount of pericardial fluid does not appears significant. Electronically Signed   By: Irish Lack M.D.   On: 07/29/2021 12:40    MAU Course  Procedures EKG NST CBC, CMP, D-dimer CT Chest Tylenol/Flexeril PO Fereheme  MDM EKG reviewed by Dr. Crissie Reese. PAC's noted, otherwise reassuring EKG. CBC with HGB 8.9, elevated D-dimer, CMP wnl. CT chest normal without evidence of PE or any other acute findings. Fereheme IV was given as HGB 8.9 could be a contributor to  intermittent SOB. Tylenol/Flexeril PO which has helped improve pain. NST reactive and reassuring for gestational age. Pain likely MSK etiology. Will send rx for Flexeril.  Assessment and Plan  [redacted] weeks gestation of pregnancy Anemia affecting pregnancy Rib pain  - Discharge home in stable condition - Rx for Flexeril sent. May also use Tylenol prn - Continue PO iron supplement. List of iron rich foods provided - Strict return precautions reviewed. Return to MAU as needed or for worsening symptoms - Keep OB appointment as scheduled    Brand Males, CNM 07/29/2021, 3:51 PM

## 2021-07-29 NOTE — MAU Note (Signed)
Pt reports pain in her left rib in the back that started last night, pain is sharp and is now radiates to her left shoulder and down her left arm. Pain is constant and makes her short of breath. Denies contraction, vaginal bleeding or ROM. Reports positive fetal movement.

## 2021-08-01 NOTE — L&D Delivery Note (Signed)
Delivery Note At 4:19 PM a viable female was delivered via Vaginal, Spontaneous (Presentation:   Occiput Anterior).  APGAR: pending ; weight pending.   Placenta status: Spontaneous, Intact.  Cord: 3 vessels with the following complications: None.  Cord pH: not sent  Anesthesia: Epidural Episiotomy: None Lacerations: None   Suture Repair:  None Est. Blood Loss (mL):  200cc   It's another girl - "Saige"!!   Mom to postpartum.  Baby to Couplet care / Skin to Skin.  Ranae Pila 09/02/2021, 4:34 PM

## 2021-08-25 LAB — OB RESULTS CONSOLE GC/CHLAMYDIA
Chlamydia: NEGATIVE
Gonorrhea: NEGATIVE

## 2021-08-25 LAB — OB RESULTS CONSOLE GBS: GBS: NEGATIVE

## 2021-09-02 ENCOUNTER — Inpatient Hospital Stay (HOSPITAL_COMMUNITY)
Admission: AD | Admit: 2021-09-02 | Discharge: 2021-09-04 | DRG: 807 | Disposition: A | Discharge status: Home / Self Care | Payer: Medicaid Other | Attending: Obstetrics and Gynecology | Admitting: Obstetrics and Gynecology

## 2021-09-02 ENCOUNTER — Inpatient Hospital Stay (HOSPITAL_COMMUNITY): Payer: Medicaid Other | Admitting: Anesthesiology

## 2021-09-02 ENCOUNTER — Encounter (HOSPITAL_COMMUNITY): Payer: Self-pay | Admitting: Obstetrics and Gynecology

## 2021-09-02 ENCOUNTER — Other Ambulatory Visit: Payer: Self-pay

## 2021-09-02 DIAGNOSIS — Z3A38 38 weeks gestation of pregnancy: Secondary | ICD-10-CM | POA: Diagnosis not present

## 2021-09-02 DIAGNOSIS — O26893 Other specified pregnancy related conditions, third trimester: Secondary | ICD-10-CM | POA: Diagnosis present

## 2021-09-02 DIAGNOSIS — Z20822 Contact with and (suspected) exposure to covid-19: Secondary | ICD-10-CM | POA: Diagnosis present

## 2021-09-02 LAB — TYPE AND SCREEN
ABO/RH(D): B POS
Antibody Screen: NEGATIVE

## 2021-09-02 LAB — CBC
HCT: 33.3 % — ABNORMAL LOW (ref 36.0–46.0)
Hemoglobin: 10.3 g/dL — ABNORMAL LOW (ref 12.0–15.0)
MCH: 23.9 pg — ABNORMAL LOW (ref 26.0–34.0)
MCHC: 30.9 g/dL (ref 30.0–36.0)
MCV: 77.3 fL — ABNORMAL LOW (ref 80.0–100.0)
Platelets: 225 10*3/uL (ref 150–400)
RBC: 4.31 MIL/uL (ref 3.87–5.11)
RDW: 18.4 % — ABNORMAL HIGH (ref 11.5–15.5)
WBC: 9.3 10*3/uL (ref 4.0–10.5)
nRBC: 0 % (ref 0.0–0.2)

## 2021-09-02 LAB — RESP PANEL BY RT-PCR (FLU A&B, COVID) ARPGX2
Influenza A by PCR: NEGATIVE
Influenza B by PCR: NEGATIVE
SARS Coronavirus 2 by RT PCR: NEGATIVE

## 2021-09-02 LAB — POCT FERN TEST: POCT Fern Test: POSITIVE

## 2021-09-02 MED ORDER — SOD CITRATE-CITRIC ACID 500-334 MG/5ML PO SOLN: 30.0000 mL | ORAL | Status: DC | PRN

## 2021-09-02 MED ORDER — FENTANYL-BUPIVACAINE-NACL 0.5-0.125-0.9 MG/250ML-% EP SOLN
12.0000 mL/h | EPIDURAL | Status: DC | PRN
  Filled 2021-09-02: qty 250

## 2021-09-02 MED ORDER — PHENYLEPHRINE 40 MCG/ML (10ML) SYRINGE FOR IV PUSH (FOR BLOOD PRESSURE SUPPORT)
80.0000 ug | PREFILLED_SYRINGE | INTRAVENOUS | Status: DC | PRN
  Filled 2021-09-02: qty 10

## 2021-09-02 MED ORDER — OXYCODONE HCL 5 MG PO TABS: 10.0000 mg | ORAL_TABLET | ORAL | Status: DC | PRN

## 2021-09-02 MED ORDER — PHENYLEPHRINE 40 MCG/ML (10ML) SYRINGE FOR IV PUSH (FOR BLOOD PRESSURE SUPPORT): 80.0000 ug | PREFILLED_SYRINGE | INTRAVENOUS | Status: DC | PRN

## 2021-09-02 MED ORDER — OXYTOCIN BOLUS FROM INFUSION
333.0000 mL | Freq: Once | INTRAVENOUS | Status: AC
  Administered 2021-09-02: 333 mL via INTRAVENOUS

## 2021-09-02 MED ORDER — ACETAMINOPHEN 325 MG PO TABS: 650.0000 mg | ORAL_TABLET | ORAL | Status: DC | PRN

## 2021-09-02 MED ORDER — EPHEDRINE 5 MG/ML INJ: 10.0000 mg | INTRAVENOUS | Status: DC | PRN

## 2021-09-02 MED ORDER — OXYCODONE-ACETAMINOPHEN 5-325 MG PO TABS: 2.0000 | ORAL_TABLET | ORAL | Status: DC | PRN

## 2021-09-02 MED ORDER — FLEET ENEMA 7-19 GM/118ML RE ENEM: 1.0000 | ENEMA | RECTAL | Status: DC | PRN

## 2021-09-02 MED ORDER — SIMETHICONE 80 MG PO CHEW: 80.0000 mg | CHEWABLE_TABLET | ORAL | Status: DC | PRN

## 2021-09-02 MED ORDER — SENNOSIDES-DOCUSATE SODIUM 8.6-50 MG PO TABS
2.0000 | ORAL_TABLET | ORAL | Status: DC
  Administered 2021-09-02 – 2021-09-03 (×2): 2 via ORAL
  Filled 2021-09-02 (×3): qty 2

## 2021-09-02 MED ORDER — LACTATED RINGERS IV SOLN: INTRAVENOUS | Status: DC

## 2021-09-02 MED ORDER — OXYCODONE HCL 5 MG PO TABS: 5.0000 mg | ORAL_TABLET | ORAL | Status: DC | PRN

## 2021-09-02 MED ORDER — LIDOCAINE HCL (PF) 1 % IJ SOLN: 30.0000 mL | INTRAMUSCULAR | Status: DC | PRN

## 2021-09-02 MED ORDER — DIPHENHYDRAMINE HCL 50 MG/ML IJ SOLN: 12.5000 mg | INTRAMUSCULAR | Status: DC | PRN

## 2021-09-02 MED ORDER — BENZOCAINE-MENTHOL 20-0.5 % EX AERO: 1.0000 "application " | INHALATION_SPRAY | CUTANEOUS | Status: DC | PRN

## 2021-09-02 MED ORDER — IBUPROFEN 600 MG PO TABS
600.0000 mg | ORAL_TABLET | Freq: Four times a day (QID) | ORAL | Status: DC
  Administered 2021-09-02 – 2021-09-04 (×6): 600 mg via ORAL
  Filled 2021-09-02 (×7): qty 1

## 2021-09-02 MED ORDER — ZOLPIDEM TARTRATE 5 MG PO TABS: 5.0000 mg | ORAL_TABLET | Freq: Every evening | ORAL | Status: DC | PRN

## 2021-09-02 MED ORDER — PRENATAL MULTIVITAMIN CH
1.0000 | ORAL_TABLET | Freq: Every day | ORAL | Status: DC
  Administered 2021-09-03: 1 via ORAL
  Filled 2021-09-02: qty 1

## 2021-09-02 MED ORDER — OXYTOCIN-SODIUM CHLORIDE 30-0.9 UT/500ML-% IV SOLN: 2.5000 [IU]/h | INTRAVENOUS | Status: DC

## 2021-09-02 MED ORDER — OXYCODONE-ACETAMINOPHEN 5-325 MG PO TABS: 1.0000 | ORAL_TABLET | ORAL | Status: DC | PRN

## 2021-09-02 MED ORDER — FENTANYL-BUPIVACAINE-NACL 0.5-0.125-0.9 MG/250ML-% EP SOLN
EPIDURAL | Status: DC | PRN
  Administered 2021-09-02: 12 mL/h via EPIDURAL

## 2021-09-02 MED ORDER — ONDANSETRON HCL 4 MG PO TABS: 4.0000 mg | ORAL_TABLET | ORAL | Status: DC | PRN

## 2021-09-02 MED ORDER — TERBUTALINE SULFATE 1 MG/ML IJ SOLN: 0.2500 mg | Freq: Once | INTRAMUSCULAR | Status: DC | PRN

## 2021-09-02 MED ORDER — DIPHENHYDRAMINE HCL 25 MG PO CAPS: 25.0000 mg | ORAL_CAPSULE | Freq: Four times a day (QID) | ORAL | Status: DC | PRN

## 2021-09-02 MED ORDER — TETANUS-DIPHTH-ACELL PERTUSSIS 5-2.5-18.5 LF-MCG/0.5 IM SUSY: 0.5000 mL | PREFILLED_SYRINGE | Freq: Once | INTRAMUSCULAR | Status: DC

## 2021-09-02 MED ORDER — COCONUT OIL OIL: 1.0000 "application " | TOPICAL_OIL | Status: DC | PRN

## 2021-09-02 MED ORDER — ONDANSETRON HCL 4 MG/2ML IJ SOLN: 4.0000 mg | INTRAMUSCULAR | Status: DC | PRN

## 2021-09-02 MED ORDER — ONDANSETRON HCL 4 MG/2ML IJ SOLN: 4.0000 mg | Freq: Four times a day (QID) | INTRAMUSCULAR | Status: DC | PRN

## 2021-09-02 MED ORDER — LACTATED RINGERS IV SOLN
500.0000 mL | INTRAVENOUS | Status: DC | PRN
  Administered 2021-09-02: 1000 mL via INTRAVENOUS

## 2021-09-02 MED ORDER — OXYTOCIN-SODIUM CHLORIDE 30-0.9 UT/500ML-% IV SOLN
1.0000 m[IU]/min | INTRAVENOUS | Status: DC
  Administered 2021-09-02: 2 m[IU]/min via INTRAVENOUS
  Filled 2021-09-02: qty 500

## 2021-09-02 MED ORDER — LACTATED RINGERS IV SOLN: 500.0000 mL | Freq: Once | INTRAVENOUS | Status: DC

## 2021-09-02 MED ORDER — DIBUCAINE (PERIANAL) 1 % EX OINT: 1.0000 "application " | TOPICAL_OINTMENT | CUTANEOUS | Status: DC | PRN

## 2021-09-02 MED ORDER — WITCH HAZEL-GLYCERIN EX PADS: 1.0000 "application " | MEDICATED_PAD | CUTANEOUS | Status: DC | PRN

## 2021-09-02 MED ORDER — LIDOCAINE HCL (PF) 1 % IJ SOLN
INTRAMUSCULAR | Status: DC | PRN
  Administered 2021-09-02: 2 mL via EPIDURAL
  Administered 2021-09-02: 10 mL via EPIDURAL

## 2021-09-02 NOTE — Progress Notes (Signed)
Comf with CLe.  8/c/0

## 2021-09-02 NOTE — Lactation Note (Signed)
This note was copied from a baby's chart. Lactation Consultation Note  Patient Name: Karen Meyers [id number] Date: 09/02/2021 Reason for consult: Follow-up assessment;Early term 37-38.6wks Age:25 hours Per mom, she made attempt earlier but infant would not latch. Look at maternal data below for past breast experience. Mom was given hand pump to pre-pump breast prior to latching infant to help evert nipple shaft out more. See latch score. Mom latched infant on her left breast using the cross cradle hold, infant was on and off few times and breastfeed for 4 minutes. Mom already knows how to hand express and infant was given 8 mls of colostrum by spoon. Mom expressed more afterwards both breast and hand 5 mls in container.  Mom will continue to work on latching infant at breast and mom knows how to hand express and offer colostrum by spoon if infant doesn't latch. Mom knows breast millk is safe at room temperature for 4 hours. Mom shown how to use hand pump  & how to disassemble, clean, & reassemble parts.  Mom made aware of O/P services, breastfeeding support groups, community resources, and our phone # for post-discharge questions.   Mom's plan: 1- Mom will pre-pump breast with hand pump prior to latching infant at the breast. 2- Mom will continue to breastfeed infant according to hunger cues, 8 to 12+ or more times within 24 hours, skin to skin. 3- If infant doesn't latch mom will hand express and give infant back her EBM by spoon. 4- Mom knows to call RN/LC if she needs further assistance with latching infant at the breast. Maternal Data Has patient been taught Hand Expression?: Yes Does the patient have breastfeeding experience prior to this delivery?: Yes How long did the patient breastfeed?: Mom attemped to breast feed for first 2-3 months trouble with latch, infant milk allergy.  Feeding Mother's Current Feeding Choice: Breast Milk  LATCH Score Latch: Repeated attempts needed to  sustain latch, nipple held in mouth throughout feeding, stimulation needed to elicit sucking reflex.  Audible Swallowing: A few with stimulation  Type of Nipple: Everted at rest and after stimulation (Mom is short shafted)  Comfort (Breast/Nipple): Soft / non-tender  Hold (Positioning): Assistance needed to correctly position infant at breast and maintain latch.  LATCH Score: 7   Lactation Tools Discussed/Used Tools: Pump Breast pump type: Manual Pump Education: Setup, frequency, and cleaning;Milk Storage Reason for Pumping: Mom will pre-pump to help evert nipple shaft out more to help with latch. Pumping frequency: Prn  Interventions Interventions: Breast feeding basics reviewed;Skin to skin;Hand express;Pre-pump if needed;Breast compression;Adjust position;Support pillows;Position options;Expressed milk;Hand pump;Education;LC Services brochure  Discharge    Consult Status Consult Status: Follow-up Date: 09/03/21 Follow-up type: In-patient    Vicente Serene 09/02/2021, 9:04 PM

## 2021-09-02 NOTE — Lactation Note (Signed)
This note was copied from a baby's chart. Lactation Consultation Note  Patient Name: Karen Meyers OMVEH'M Date: 09/02/2021 Reason for consult: Initial assessment;Mother's request;Breastfeeding assistance Age:25 hours  LC assisted with latching with signs of milk transfer. Infant still feeding at the end of the visit. Mom denied any pain with the latch.  Mom still feeding at the end of the visit. Mom to get more LC support on the floor.   Maternal Data Has patient been taught Hand Expression?: Yes Does the patient have breastfeeding experience prior to this delivery?: Yes How long did the patient breastfeed?: Mom attemped to breast feed for first 2-3 months trouble with latch, infant milk allergy.  Feeding Mother's Current Feeding Choice: Breast Milk  LATCH Score Latch: Repeated attempts needed to sustain latch, nipple held in mouth throughout feeding, stimulation needed to elicit sucking reflex.  Audible Swallowing: A few with stimulation  Type of Nipple: Everted at rest and after stimulation  Comfort (Breast/Nipple): Soft / non-tender  Hold (Positioning): Assistance needed to correctly position infant at breast and maintain latch.  LATCH Score: 7   Lactation Tools Discussed/Used    Interventions Interventions: Breast feeding basics reviewed;Adjust position;Assisted with latch;Support pillows;Skin to skin;Expressed milk;Breast compression;Visual merchandiser education  Discharge    Consult Status Consult Status: Follow-up from L&D Date: 09/03/21 Follow-up type: In-patient    Karen Biello  Meyers 09/02/2021, 5:48 PM

## 2021-09-02 NOTE — Anesthesia Procedure Notes (Signed)
Epidural Patient location during procedure: OB Start time: 09/02/2021 2:25 PM End time: 09/02/2021 1:03 PM  Staffing Anesthesiologist: Lannie Fields, DO Performed: anesthesiologist   Preanesthetic Checklist Completed: patient identified, IV checked, risks and benefits discussed, monitors and equipment checked, pre-op evaluation and timeout performed  Epidural Patient position: sitting Prep: DuraPrep and site prepped and draped Patient monitoring: continuous pulse ox, blood pressure, heart rate and cardiac monitor Approach: midline Location: L3-L4 Injection technique: LOR air  Needle:  Needle type: Tuohy  Needle gauge: 17 G Needle length: 9 cm Needle insertion depth: 9 cm Catheter type: closed end flexible Catheter size: 19 Gauge Catheter at skin depth: 14 cm Test dose: negative  Assessment Sensory level: T8 Events: blood not aspirated, injection not painful, no injection resistance, no paresthesia and negative IV test  Additional Notes Patient identified. Risks/Benefits/Options discussed with patient including but not limited to bleeding, infection, nerve damage, paralysis, failed block, incomplete pain control, headache, blood pressure changes, nausea, vomiting, reactions to medication both or allergic, itching and postpartum back pain. Confirmed with bedside nurse the patient's most recent platelet count. Confirmed with patient that they are not currently taking any anticoagulation, have any bleeding history or any family history of bleeding disorders. Patient expressed understanding and wished to proceed. All questions were answered. Sterile technique was used throughout the entire procedure. Please see nursing notes for vital signs. Test dose was given through epidural catheter and negative prior to continuing to dose epidural or start infusion. Warning signs of high block given to the patient including shortness of breath, tingling/numbness in hands, complete motor block,  or any concerning symptoms with instructions to call for help. Patient was given instructions on fall risk and not to get out of bed. All questions and concerns addressed with instructions to call with any issues or inadequate analgesia.  Reason for block:procedure for pain

## 2021-09-02 NOTE — MAU Note (Signed)
Presents with c/o LOF since 1100 this morning, reports fluid clear.  Also states having ctxs every 5 minutes, began @ 1100 too.  Denies VB.  Reports +FM.

## 2021-09-02 NOTE — H&P (Signed)
Karen Meyers is a 26 y.o. female presenting for SROM and labor. Was 4cm in the office yesterday. Today ROM at 10am followed by contractions shortly thereafter. 5.5cm in triage. Pregnancy c/b cricumvallente placenta and has had reassuring antenatal testing including recent growth [place] with EFW. History of anxiety.   OB History     Gravida  2   Para  1   Term  1   Preterm      AB      Living  1      SAB      IAB      Ectopic      Multiple  0   Live Births  1          Past Medical History:  Diagnosis Date   Anxiety    Depression    Past Surgical History:  Procedure Laterality Date   WISDOM TOOTH EXTRACTION     Family History: family history is not on file. Social History:  reports that she has never smoked. She has never used smokeless tobacco. She reports that she does not currently use alcohol. She reports that she does not use drugs.     Maternal Diabetes: No Genetic Screening: Normal Maternal Ultrasounds/Referrals: Normal Fetal Ultrasounds or other Referrals:  None Maternal Substance Abuse:  No Significant Maternal Medications:  None Significant Maternal Lab Results:  Group B Strep negative Other Comments:  None  Review of Systems History Dilation: 5.5 Effacement (%): 60 Station: -2 Exam by:: Holly Flippin RN Blood pressure 125/78, pulse (!) 103, temperature 98.3 F (36.8 C), temperature source Oral, resp. rate 16, height 5\' 5"  (1.651 m), weight 104.3 kg, SpO2 100 %, unknown if currently breastfeeding. Exam Physical Exam  NAD, A&O NWOB Abd soft, nondistended, gravid  Prenatal labs: ABO, Rh: --/--/PENDING (02/02 1313) Antibody: PENDING (02/02 1313) Rubella:   RPR:    HBsAg:    HIV:    GBS:   negative  Assessment/Plan: 26 yo G2P1 @ 38.1 wga presenting in labor and s/p ROM. Expectant management. GBS negative.     25 09/02/2021, 1:53 PM

## 2021-09-02 NOTE — Anesthesia Preprocedure Evaluation (Signed)
Anesthesia Evaluation  Patient identified by MRN, date of birth, ID band Patient awake    Reviewed: Allergy & Precautions, Patient's Chart, lab work & pertinent test results  Airway Mallampati: II  TM Distance: >3 FB Neck ROM: Full    Dental no notable dental hx.    Pulmonary neg pulmonary ROS,    Pulmonary exam normal breath sounds clear to auscultation       Cardiovascular negative cardio ROS Normal cardiovascular exam Rhythm:Regular Rate:Normal     Neuro/Psych PSYCHIATRIC DISORDERS Anxiety Depression negative neurological ROS     GI/Hepatic negative GI ROS, Neg liver ROS,   Endo/Other  Obesity BMI 38  Renal/GU negative Renal ROS  negative genitourinary   Musculoskeletal negative musculoskeletal ROS (+)   Abdominal   Peds  Hematology negative hematology ROS (+) hct 33.3, plt 225   Anesthesia Other Findings   Reproductive/Obstetrics (+) Pregnancy                             Anesthesia Physical Anesthesia Plan  ASA: 2  Anesthesia Plan: Epidural   Post-op Pain Management:    Induction:   PONV Risk Score and Plan: 2  Airway Management Planned: Natural Airway  Additional Equipment: None  Intra-op Plan:   Post-operative Plan:   Informed Consent: I have reviewed the patients History and Physical, chart, labs and discussed the procedure including the risks, benefits and alternatives for the proposed anesthesia with the patient or authorized representative who has indicated his/her understanding and acceptance.       Plan Discussed with:   Anesthesia Plan Comments:         Anesthesia Quick Evaluation

## 2021-09-03 LAB — CBC
HCT: 29.8 % — ABNORMAL LOW (ref 36.0–46.0)
Hemoglobin: 9.2 g/dL — ABNORMAL LOW (ref 12.0–15.0)
MCH: 23.4 pg — ABNORMAL LOW (ref 26.0–34.0)
MCHC: 30.9 g/dL (ref 30.0–36.0)
MCV: 75.8 fL — ABNORMAL LOW (ref 80.0–100.0)
Platelets: 197 10*3/uL (ref 150–400)
RBC: 3.93 MIL/uL (ref 3.87–5.11)
RDW: 18.2 % — ABNORMAL HIGH (ref 11.5–15.5)
WBC: 12.5 10*3/uL — ABNORMAL HIGH (ref 4.0–10.5)
nRBC: 0 % (ref 0.0–0.2)

## 2021-09-03 LAB — RPR: RPR Ser Ql: NONREACTIVE

## 2021-09-03 MED ORDER — IBUPROFEN 600 MG PO TABS: 600.0000 mg | ORAL_TABLET | Freq: Four times a day (QID) | ORAL | 0 refills | Status: AC | PRN

## 2021-09-03 NOTE — Discharge Summary (Addendum)
Postpartum Discharge Summary      Patient Name: Karen Meyers DOB: 12/12/1995 MRN: [id number]  Date of admission: 09/02/2021 Delivery date:09/02/2021  Delivering provider: Tyson Dense  Date of discharge: 09/04/2021  Admitting diagnosis: Normal labor and delivery [O80] Intrauterine pregnancy: [redacted]w[redacted]d    Secondary diagnosis:  Principal Problem:   Normal labor and delivery  Additional problems:      Discharge diagnosis: Term Pregnancy Delivered                                              Post partum procedures:   Augmentation: N/A Complications: None  Hospital course: Onset of Labor With Vaginal Delivery      26y.o. yo [id number] admitted in Active Labor on 09/02/2021. Patient had an uncomplicated labor course as follows:  Membrane Rupture Time/Date: 11:00 AM ,09/02/2021   Delivery Method:Vaginal, Spontaneous  Episiotomy: None  Lacerations:  None  Patient had an uncomplicated postpartum course.  She is ambulating, tolerating a regular diet, passing flatus, and urinating well. Patient is discharged home in stable condition on 09/03/21.  Newborn Data: Birth date:09/02/2021  Birth time:4:19 PM  Gender:Female  Living status:Living  Apgars:8 ,9  Weight:3260 g   Magnesium Sulfate received: No BMZ received: No Rhophylac:N/A MMR:No T-DaP:Given prenatally Flu: N/A Transfusion:No  Physical exam  Vitals:   09/02/21 1923 09/02/21 2343 09/03/21 0402 09/03/21 0757  BP: 119/75 133/77 116/70 125/69  Pulse: 72 78 72 72  Resp: _0 Temp: 97.7 F (36.5 C) 98.4 F (36.9 C) 98.2 F (36.8 C) 98.4 F (36.9 C)  TempSrc: Oral  Oral Oral  SpO2:  100%  100%  Weight:      Height:       General: alert, cooperative, and no distress Lochia: appropriate Uterine Fundus: firm Incision: N/A DVT Evaluation: No evidence of DVT seen on physical exam. Labs: Lab Results  Component Value Date   WBC 12.5 (H) 09/03/2021   HGB 9.2 (L) 09/03/2021   HCT 29.8 (L)  09/03/2021   MCV 75.8 (L) 09/03/2021   PLT 197 09/03/2021   CMP Latest Ref Rng & Units 07/29/2021  Glucose 70 - 99 mg/dL 94  BUN 6 - 20 mg/dL <5(L)  Creatinine 0.44 - 1.00 mg/dL 0.63  Sodium 135 - 145 mmol/L 137  Potassium 3.5 - 5.1 mmol/L 3.3(L)  Chloride 98 - 111 mmol/L 107  CO2 22 - 32 mmol/L 23  Calcium 8.9 - 10.3 mg/dL 8.7(L)  Total Protein 6.5 - 8.1 g/dL 5.9(L)  Total Bilirubin 0.3 - 1.2 mg/dL 0.6  Alkaline Phos 38 - 126 U/L 93  AST 15 - 41 U/L 14(L)  ALT 0 - 44 U/L 11   Edinburgh Score: Edinburgh Postnatal Depression Scale Screening Tool 09/02/2021  I have been able to laugh and see the funny side of things. 0  I have looked forward with enjoyment to things. 0  I have blamed myself unnecessarily when things went wrong. 2  I have been anxious or worried for no good reason. 2  I have felt scared or panicky for no good reason. 1  Things have been getting on top of me. 0  I have been so unhappy that I have had difficulty sleeping. 0  I have felt sad or miserable. 1  I have been so unhappy that I  have been crying. 0  The thought of harming myself has occurred to me. 0  Edinburgh Postnatal Depression Scale Total 6      After visit meds:  Allergies as of 09/03/2021   No Known Allergies      Medication List     TAKE these medications    buPROPion 300 MG 24 hr tablet Commonly known as: WELLBUTRIN XL Take 300 mg by mouth at bedtime.   cyclobenzaprine 10 MG tablet Commonly known as: FLEXERIL Take 1 tablet (10 mg total) by mouth 2 (two) times daily as needed for muscle spasms.   ibuprofen 600 MG tablet Commonly known as: ADVIL Take 1 tablet (600 mg total) by mouth every 6 (six) hours as needed.   multivitamin-prenatal 27-0.8 MG Tabs tablet Take 1 tablet by mouth daily at 12 noon.         Discharge home in stable condition Infant Feeding: Breast Infant Disposition:home with mother Discharge instruction: per After Visit Summary and Postpartum  booklet. Activity: Advance as tolerated. Pelvic rest for 6 weeks.  Diet: routine diet Anticipated Birth Control: Unsure Postpartum Appointment:6 weeks Additional Postpartum F/U:    Future Appointments:No future appointments. Follow up Visit:      09/03/2021 Luz Lex, MD

## 2021-09-03 NOTE — Anesthesia Postprocedure Evaluation (Signed)
Anesthesia Post Note  Patient: Karen Meyers  Procedure(s) Performed: AN AD HOC LABOR EPIDURAL     Patient location during evaluation: Mother Baby Anesthesia Type: Epidural Level of consciousness: awake and alert and oriented Pain management: satisfactory to patient Vital Signs Assessment: post-procedure vital signs reviewed and stable Respiratory status: respiratory function stable Cardiovascular status: stable Postop Assessment: no headache, no backache, epidural receding, patient able to bend at knees, no signs of nausea or vomiting, adequate PO intake and able to ambulate Anesthetic complications: no   No notable events documented.  Last Vitals:  Vitals:   09/03/21 0402 09/03/21 0757  BP: 116/70 125/69  Pulse: 72 72  Resp: 16 20  Temp: 36.8 C 36.9 C  SpO2:  100%    Last Pain:  Vitals:   09/03/21 0805  TempSrc:   PainSc: 1    Pain Goal:                   Srah Ake

## 2021-09-03 NOTE — Social Work (Addendum)
CSW received consult for hx of Anxiety and Depression. CSW met with [name] to offer support and complete assessment.   ° °CSW met with [name] at bedside and introduced CSW role. CSW observed [name] breastfeeding the infant. FOB was present at bedside. [name] presented pleasant and welcomed CSW visit with FOB present. CSW inquired how [name] has felt since giving birth. [name] reported feeling good and that the L&D was painful but overall went well. CSW inquired how [name] felt during the pregnancy. [name] reported she felt good and had no concerns with anxiety or depression. [name] reported she has always felt like she had anxiety and was diagnosed after she had her first baby about two years ago. 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 [name] reported she was prescribed Wellbutrin for her symptoms which feels helped her symptoms. [name] reported she stopped taking the Wellbutrin prior to getting pregnant but is open restarting, if needed. CSW asked about [name] her coping strategies. [name] reported that she cries and talks to family about her concerns. [name] reported she enjoys drawing, listening to music and being with her family. CSW encouraged [name] to continue implementing her coping strategies. [name] acknowledged FOB, her parents, and her close family as supports. [name] was receptive to PPD resources. CSW provided education regarding the baby blues period vs. perinatal mood disorders, discussed treatment and gave resources for mental health follow up if concerns arise.  CSW recommended [name] complete a self-evaluation during the postpartum time period using the New Mom Checklist from Postpartum Progress and encouraged [name] to contact a medical professional if symptoms are noted at any time.   ° °[name] reported she has essential items for the infant including a bassinet where the infant will sleep. CSW provided review of Sudden Infant Death Syndrome (SIDS) precautions.  [name] reported understanding. CSW assessed [name] for  additional need. [name] reported no further need.  ° °CSW identifies no further need for intervention and no barriers to discharge at this time.  ° °Karen Meyers, MSW, LCSW °[facility]  °Clinical Social Worker  °[phone] °09/03/2021  1:15 PM  °

## 2021-09-03 NOTE — Lactation Note (Addendum)
This note was copied from a baby's chart. Lactation Consultation Note  Patient Name: Karen Meyers JIRCV'E Date: 09/03/2021 Reason for consult: Follow-up assessment;Early term 37-38.6wks;Infant weight loss (-3% weight loss) Age:25 hours, female infant. Per mom, infant is latching better today feedings are 10 to 15 minutes in length, LC did not observe latch, per mom, infant recently breast feed and was given 12.5  of colostrum at 1333 pm. Per dad, they are waiting for infant to have a stool, before discharge and infant had 8 void diapers within past 22 hours.  LC discussed discharge education regarding mom and infant see below. LC discussed infant may start cluster feeding on day 2 of life and this is normal feeding behavior. LC reviewed continue breastfeed infant according to feeding cues, 8 to 12 times within 24 hours, skin to skin. Mom knows to offer infant EBM that is pumped or hand express if infant doesn't latch, mom does have DEBP at home. LC reinforced  mom to call [facility] hotline and participate in Online breastfeeding support group.  Maternal Data    Feeding Mother's Current Feeding Choice: Breast Milk  LATCH Score                    Lactation Tools Discussed/Used    Interventions    Discharge Discharge Education: Engorgement and breast care;Warning signs for feeding baby;Other (comment) (LC reminded mom of LC hotline and LC on Line breastfeeding support group.)  Consult Status Consult Status: Complete Date: 09/03/21 Follow-up type: Physician    Danelle Earthly 09/03/2021, 3:14 PM

## 2021-09-04 NOTE — Progress Notes (Signed)
Post Partum Day 2 Subjective: no complaints, up ad lib, voiding, and tolerating PO  Objective: Blood pressure 124/76, pulse 99, temperature 98.4 F (36.9 C), temperature source Oral, resp. rate 20, height 5\' 5"  (1.651 m), weight 104.3 kg, SpO2 100 %, unknown if currently breastfeeding.  Physical Exam:  General: alert, cooperative, appears stated age, and no distress Lochia: appropriate Uterine Fundus: firm Incision:   DVT Evaluation: No evidence of DVT seen on physical exam.  Recent Labs    09/02/21 1330 09/03/21 0446  HGB 10.3* 9.2*  HCT 33.3* 29.8*    Assessment/Plan: Discharge home and Breastfeeding   LOS: 2 days   11/01/21 09/04/2021, 9:54 AM

## 2021-09-14 ENCOUNTER — Telehealth (HOSPITAL_COMMUNITY): Payer: Self-pay | Admitting: *Deleted

## 2021-09-14 NOTE — Telephone Encounter (Signed)
Phone voicemail message left to return nurse call.  Duffy Rhody, RN 09-14-2021 at 10:41am

## 2022-04-29 ENCOUNTER — Emergency Department (HOSPITAL_BASED_OUTPATIENT_CLINIC_OR_DEPARTMENT_OTHER): Payer: Medicaid Other

## 2022-04-29 ENCOUNTER — Other Ambulatory Visit: Payer: Self-pay

## 2022-04-29 ENCOUNTER — Emergency Department (HOSPITAL_BASED_OUTPATIENT_CLINIC_OR_DEPARTMENT_OTHER)
Admission: EM | Admit: 2022-04-29 | Discharge: 2022-04-29 | Disposition: A | Discharge status: Home / Self Care | Payer: Medicaid Other | Attending: Emergency Medicine | Admitting: Emergency Medicine

## 2022-04-29 DIAGNOSIS — R519 Headache, unspecified: Secondary | ICD-10-CM | POA: Diagnosis not present

## 2022-04-29 LAB — PREGNANCY, URINE: Preg Test, Ur: NEGATIVE

## 2022-04-29 MED ORDER — ACETAMINOPHEN 500 MG PO TABS
1000.0000 mg | ORAL_TABLET | Freq: Once | ORAL | Status: AC
  Administered 2022-04-29: 1000 mg via ORAL
  Filled 2022-04-29: qty 2

## 2022-04-29 NOTE — ED Notes (Signed)
Orthostatic VS  Lying BP 127/73 PR 91 Sitting BP 124/83 PR 94 Standing 0 min BP 130/78 PR 104 Standing 3 min BP 139/85 PR 90

## 2022-04-29 NOTE — ED Provider Notes (Signed)
[facility] EMERGENCY DEPARTMENT Provider Note   CSN: [id number] Arrival date & time: 04/29/22  0116     History  Chief Complaint  Patient presents with   Headache    Karen Meyers is a 26 y.o. female.  The history is provided by the patient.  Headache Pain location:  Generalized Quality: squeezing/twisting in brain. Radiates to:  Does not radiate Onset quality:  Gradual Progression:  Waxing and waning Chronicity:  Chronic (many years, predates pregnancy in 2022) Similar to prior headaches: yes   Relieved by:  Nothing Worsened by:  Nothing Ineffective treatments:  None tried Associated symptoms: no cough, no diarrhea, no dizziness, no fever, no neck pain, no neck stiffness, no syncope, no URI, no visual change and no vomiting   Risk factors: no anger   Patient with anxiety and depression presents with ongoing head twisting/squeezing for years.  No fevers, no neck pain.  No changes in speech.      Past Medical History:  Diagnosis Date   Anxiety    Depression      Home Medications Prior to Admission medications   Medication Sig Start Date End Date Taking? Authorizing Provider  buPROPion (WELLBUTRIN XL) 300 MG 24 hr tablet Take 300 mg by mouth at bedtime. 04/16/20   [provider]  cyclobenzaprine (FLEXERIL) 10 MG tablet Take 1 tablet (10 mg total) by mouth 2 (two) times daily as needed for muscle spasms. 07/29/21   Brand Males, CNM  ibuprofen (ADVIL) 600 MG tablet Take 1 tablet (600 mg total) by mouth every 6 (six) hours as needed. 09/03/21   Candice Camp, MD  Prenatal Vit-Fe Fumarate-FA (MULTIVITAMIN-PRENATAL) 27-0.8 MG TABS tablet Take 1 tablet by mouth daily at 12 noon.    [provider]      Allergies    Patient has no known allergies.    Review of Systems   Review of Systems  Constitutional:  Negative for fever.  HENT:  Negative for facial swelling.   Eyes:  Negative for redness.  Respiratory:  Negative for cough.    Cardiovascular:  Negative for syncope.  Gastrointestinal:  Negative for diarrhea and vomiting.  Musculoskeletal:  Negative for neck pain and neck stiffness.  Neurological:  Positive for headaches. Negative for dizziness and facial asymmetry.  All other systems reviewed and are negative.   Physical Exam Updated Vital Signs BP 133/82 (BP Location: Right Arm)   Pulse 81   Temp 99.2 F (37.3 C) (Oral)   Resp 15   SpO2 100%  Physical Exam Vitals and nursing note reviewed.  Constitutional:      General: She is not in acute distress.    Appearance: Normal appearance. She is well-developed.  HENT:     Head: Normocephalic and atraumatic.     Nose: Nose normal.     Mouth/Throat:     Mouth: Mucous membranes are moist.     Pharynx: Oropharynx is clear.  Eyes:     Extraocular Movements: Extraocular movements intact.     Conjunctiva/sclera: Conjunctivae normal.     Pupils: Pupils are equal, round, and reactive to light.     Comments: No proptosis intact cognition disk margins sharp.    Cardiovascular:     Rate and Rhythm: Normal rate and regular rhythm.     Pulses: Normal pulses.     Heart sounds: Normal heart sounds.  Pulmonary:     Effort: Pulmonary effort is normal. No respiratory distress.     Breath sounds:  Normal breath sounds.  Abdominal:     General: Bowel sounds are normal. There is no distension.     Palpations: Abdomen is soft.     Tenderness: There is no abdominal tenderness. There is no guarding or rebound.  Genitourinary:    Vagina: No vaginal discharge.  Musculoskeletal:        General: Normal range of motion.     Cervical back: Neck supple.  Skin:    General: Skin is warm and dry.     Capillary Refill: Capillary refill takes less than 2 seconds.     Findings: No erythema or rash.  Neurological:     General: No focal deficit present.     Mental Status: She is alert and oriented to person, place, and time.     Deep Tendon Reflexes: Reflexes normal.   Psychiatric:        Mood and Affect: Mood normal.     ED Results / Procedures / Treatments   Labs (all labs ordered are listed, but only abnormal results are displayed) Labs Reviewed  PREGNANCY, URINE    EKG None  Radiology CT Head Wo Contrast  Result Date: 04/29/2022 CLINICAL DATA:  Polytrauma, blunt. Head pressure since yesterday and feeling like may pass out. EXAM: CT HEAD WITHOUT CONTRAST TECHNIQUE: Contiguous axial images were obtained from the base of the skull through the vertex without intravenous contrast. RADIATION DOSE REDUCTION: This exam was performed according to the departmental dose-optimization program which includes automated exposure control, adjustment of the mA and/or kV according to patient size and/or use of iterative reconstruction technique. COMPARISON:  None Available. FINDINGS: Brain: No acute intracranial hemorrhage, midline shift or mass effect. No extra-axial fluid collection. Gray-white matter differentiation is within normal limits. No hydrocephalus. Vascular: No hyperdense vessel or unexpected calcification. Skull: Normal. Negative for fracture or focal lesion. Sinuses/Orbits: Mild mucosal thickening in the maxillary sinuses and ethmoid air cells bilaterally. Other: None. IMPRESSION: No acute intracranial process. Electronically Signed   By: Brett Fairy M.D.   On: 04/29/2022 02:27    Procedures Procedures    Medications Ordered in ED Medications - No data to display  ED Course/ Medical Decision Making/ A&P                           Medical Decision Making Patient with ongoing head symptoms   Amount and/or Complexity of Data Reviewed External Data Reviewed: notes.    Details: Previous notes reviewed  Labs: ordered.    Details: Pregnancy is negative Radiology: ordered and independent interpretation performed.    Details: Normal head CT by me   Risk Risk Details:  Symptoms are chronic in nature.  Given cranial nerves are intact this is not  stroke or TIA.  No ICH on CT.  No proptosis, disks sharp and intact cognition with ongoing symptoms is not consistent with cavernous sinus thrombosis, given this is going on for years we would have seen something on CT.  There is nothing on exam or CT, patient is very well appearing.  Follow up with your PMD for ongoing care,   Final Clinical Impression(s) / ED Diagnoses Final diagnoses:  None   Return for intractable cough, coughing up blood, fevers > 100.4 unrelieved by medication, shortness of breath, intractable vomiting, chest pain, shortness of breath, weakness, numbness, changes in speech, facial asymmetry, abdominal pain, passing out, Inability to tolerate liquids or food, cough, altered mental status or any concerns. No signs of  systemic illness or infection. The patient is nontoxic-appearing on exam and vital signs are within normal limits.  I have reviewed the triage vital signs and the nursing notes. Pertinent labs & imaging results that were available during my care of the patient were reviewed by me and considered in my medical decision making (see chart for details). After history, exam, and medical workup I feel the patient has been appropriately medically screened and is safe for discharge home. Pertinent diagnoses were discussed with the patient. Patient was given return precautions.  Rx / DC Orders ED Discharge Orders     None         Lylliana Kitamura, MD 04/29/22 5027

## 2022-04-29 NOTE — ED Triage Notes (Signed)
Pt c/o head pressure since yesterday along with feeling like her body is heavy and feeling like she is going to pass out. No syncopal episodes. Pt states s/s have been intermittent since June 2022.

## 2022-08-19 ENCOUNTER — Other Ambulatory Visit: Payer: Self-pay

## 2022-08-19 ENCOUNTER — Emergency Department (HOSPITAL_BASED_OUTPATIENT_CLINIC_OR_DEPARTMENT_OTHER)
Admission: EM | Admit: 2022-08-19 | Discharge: 2022-08-19 | Disposition: A | Discharge status: Home / Self Care | Payer: Medicaid Other | Attending: Emergency Medicine | Admitting: Emergency Medicine

## 2022-08-19 DIAGNOSIS — R519 Headache, unspecified: Secondary | ICD-10-CM | POA: Insufficient documentation

## 2022-08-19 NOTE — ED Provider Notes (Signed)
[facility] Provider Note   CSN: [id number] Arrival date & time: 08/19/22  1854     History  Chief Complaint  Patient presents with   Headache    Karen Meyers is a 27 y.o. female.  Patient presents the emergency department complaining of a sensation of a "pop" in the back of her neck followed by a headache and a strange feeling in bilateral extremities.  She also endorses feeling like she was having an anxiety attack secondary to the headache.  This occurred at approximately 6 PM tonight.  She denies shortness of breath, vision changes, lightheadedness, dizziness, syncope, chest pain, abdominal pain, nausea, vomiting.  Past medical history significant for depression and anxiety.  The patient states she began taking her Wellbutrin today after electing to stop it voluntarily for a period of time.  HPI     Home Medications Prior to Admission medications   Medication Sig Start Date End Date Taking? Authorizing Provider  buPROPion (WELLBUTRIN XL) 300 MG 24 hr tablet Take 300 mg by mouth at bedtime. 04/16/20   [provider]  cyclobenzaprine (FLEXERIL) 10 MG tablet Take 1 tablet (10 mg total) by mouth 2 (two) times daily as needed for muscle spasms. 07/29/21   Renee Harder, CNM  ibuprofen (ADVIL) 600 MG tablet Take 1 tablet (600 mg total) by mouth every 6 (six) hours as needed. 09/03/21   Louretta Shorten, MD  Prenatal Vit-Fe Fumarate-FA (MULTIVITAMIN-PRENATAL) 27-0.8 MG TABS tablet Take 1 tablet by mouth daily at 12 noon.    [provider]      Allergies    Patient has no known allergies.    Review of Systems   Review of Systems  Neurological:  Positive for headaches (Now resolved).    Physical Exam Updated Vital Signs BP 126/73 (BP Location: Left Arm)   Pulse 93   Temp 98.4 F (36.9 C) (Oral)   Resp 20   SpO2 100%  Physical Exam Vitals and nursing note reviewed.  Constitutional:      General: She is not  in acute distress.    Appearance: She is well-developed.  HENT:     Head: Normocephalic and atraumatic.  Eyes:     Extraocular Movements: Extraocular movements intact.     Conjunctiva/sclera: Conjunctivae normal.     Pupils: Pupils are equal, round, and reactive to light.  Cardiovascular:     Rate and Rhythm: Normal rate and regular rhythm.     Heart sounds: No murmur heard. Pulmonary:     Effort: Pulmonary effort is normal. No respiratory distress.     Breath sounds: Normal breath sounds.  Abdominal:     Palpations: Abdomen is soft.     Tenderness: There is no abdominal tenderness.  Musculoskeletal:        General: No swelling.     Cervical back: Neck supple.  Skin:    General: Skin is warm and dry.     Capillary Refill: Capillary refill takes less than 2 seconds.  Neurological:     Mental Status: She is alert.     Comments: Cranial nerves II through VII, XI, XII intact.  Normal gait.  No weakness.  No sensory deficit.  Normal coordination.  No visual field deficit  Psychiatric:        Mood and Affect: Mood normal.     ED Results / Procedures / Treatments   Labs (all labs ordered are listed, but only abnormal results are displayed)  Labs Reviewed - No data to display  EKG None  Radiology No results found.  Procedures Procedures    Medications Ordered in ED Medications - No data to display  ED Course/ Medical Decision Making/ A&P                             Medical Decision Making  The patient presented with a chief complaint of headache.  Differential diagnosis includes cluster headache, tension headache, migraine, intracranial abnormalities, and others.  Upon my assessment the patient was no longer symptomatic.  Her headache had resolved.  She no longer had any of the strange feelings in her lower extremities.  I reviewed the patient's past medical history including results from a CT head without contrast from September of this past year which showed no acute  process.  I also reviewed emergency department visit notes from the same time the patient presented for headache.  I see no indication at this time for labs or imaging.  The patient is currently asymptomatic.  The patient discussed some recent lightheadedness she had been feeling.  I had orthostatic vitals checked which were all normal.  She states that she had full blood work done at her primary care appointment earlier in the week which was normal.  I see no indication at this time for fluids or further workup as primary care is currently evaluating the same.  Plan to discharge patient home.  Unclear cause of patient's earlier headache.  The patient does endorse feeling high anxiety shortly after feeling the pop in her neck.  She has normal range of motion of the neck with no pain at this time.  I feel comfortable discharging the patient home at this time with continued follow-up as needed with her primary care provider.  Return precautions provided.        Final Clinical Impression(s) / ED Diagnoses Final diagnoses:  Acute nonintractable headache, unspecified headache type    Rx / DC Orders ED Discharge Orders     None         Ronny Bacon 08/19/22 2137    Jeanell Sparrow, DO 08/20/22 2147

## 2022-08-19 NOTE — Discharge Instructions (Addendum)
You were evaluated today after a experiencing a headache which resolved on its own.  Please follow-up with your primary care provider to continue to talk about your recent lightheadedness.  Your orthostatic vitals were negative for changes which is a reassuring sign.  If you develop a sudden onset, worst of life type headache, or other life-threatening symptoms, you should return to the emergency department for reevaluation.

## 2022-08-19 NOTE — ED Triage Notes (Signed)
Pt was talking to her mother on the phone, felt a "pop" in the back of her head and then began to have an anxiety attack. States she still has pain in the back of her head.  Feels that her "limbs are in cold water"

## 2023-04-06 IMAGING — CT CT ANGIO CHEST
2 of 7 series · 17 of 46 positions shown · IV contrast (APPLIED)
Comparison: None.

CLINICAL DATA: Shortness of breath, back pain and shoulder pain.
Currently approximately 32-33 weeks pregnant.

EXAM:
CT ANGIOGRAPHY CHEST WITH CONTRAST
TECHNIQUE: Multidetector CT imaging of the chest was performed using the
standard protocol during bolus administration of intravenous
contrast. Multiplanar CT image reconstructions and MIPs were
obtained to evaluate the vascular anatomy.
CONTRAST:  60mL OMNIPAQUE IOHEXOL 350 MG/ML SOLN

[Series 7: thins · axial · 0.81mm/px · z∈[-50,+127]mm · 14 of 285 slices shown]
[im 16/285  lung]
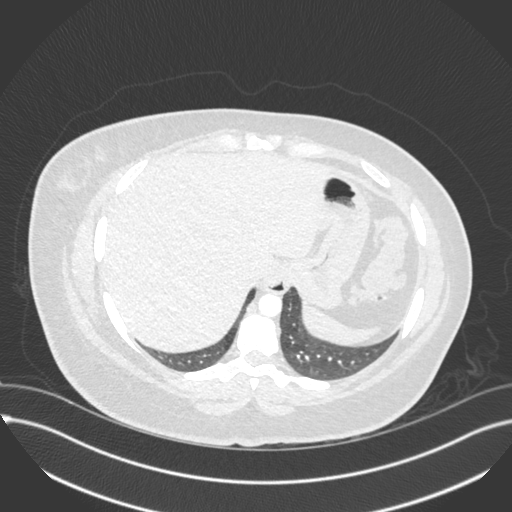
[im 32/285  soft-tissue]
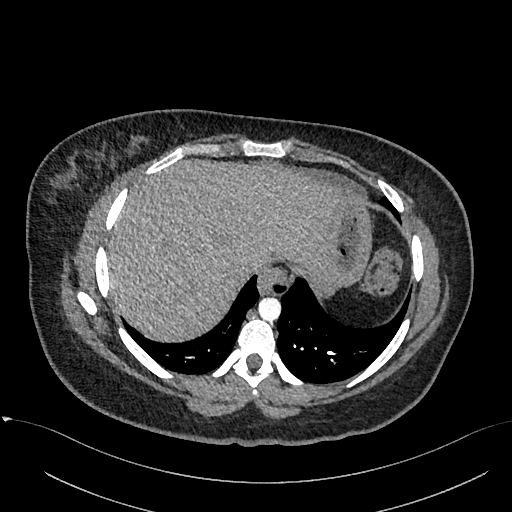
[im 64/285  lung]
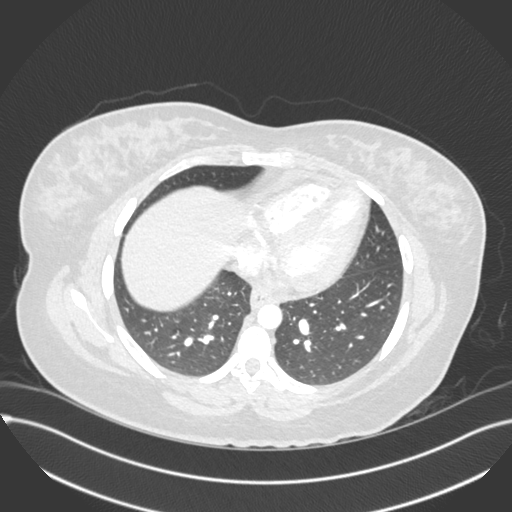
[im 79/285  soft-tissue]
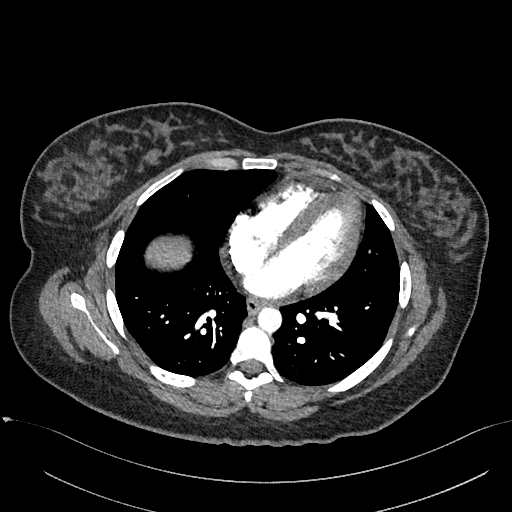
[im 95/285  lung]
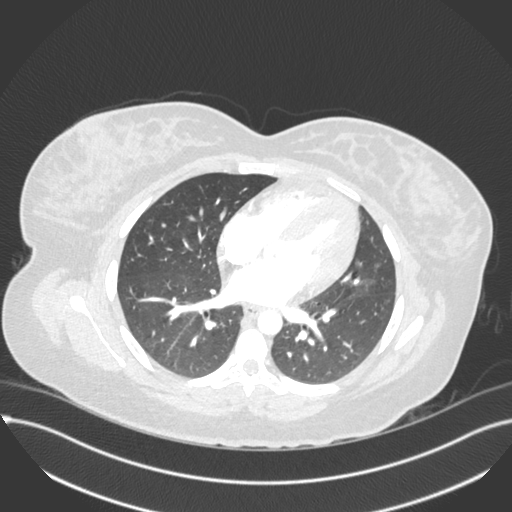
[im 111/285  soft-tissue]
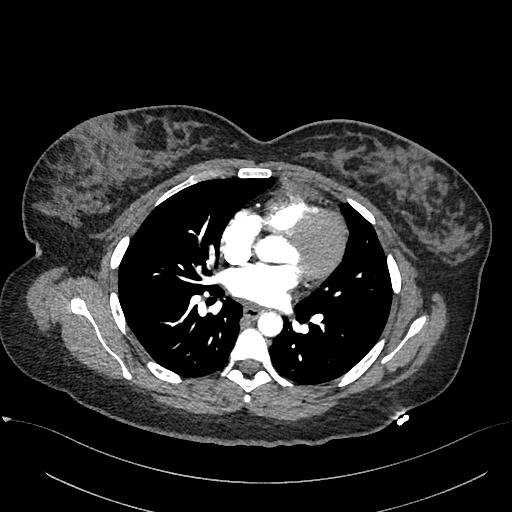
[im 127/285  lung]
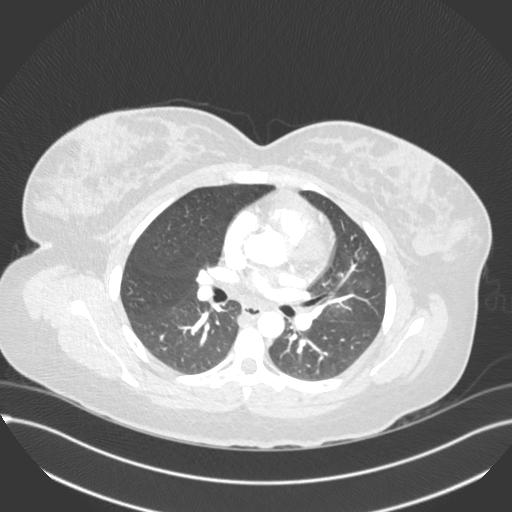
[im 158/285  soft-tissue]
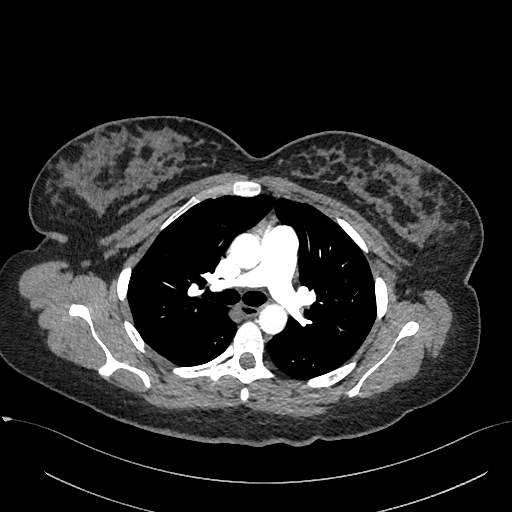
[im 174/285  lung]
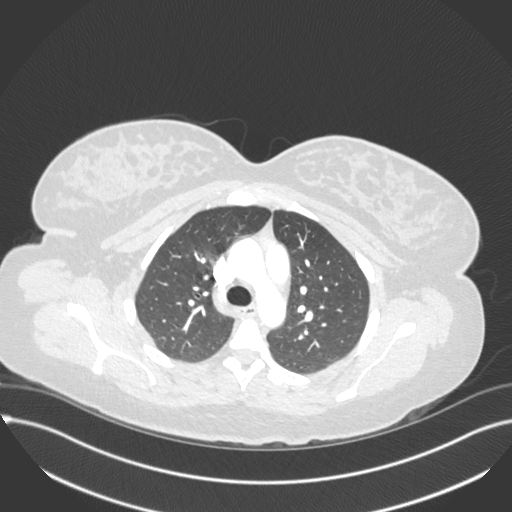
[im 190/285  soft-tissue]
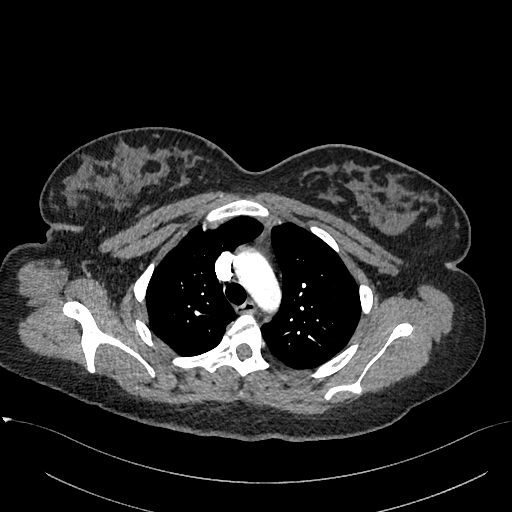
[im 206/285  lung]
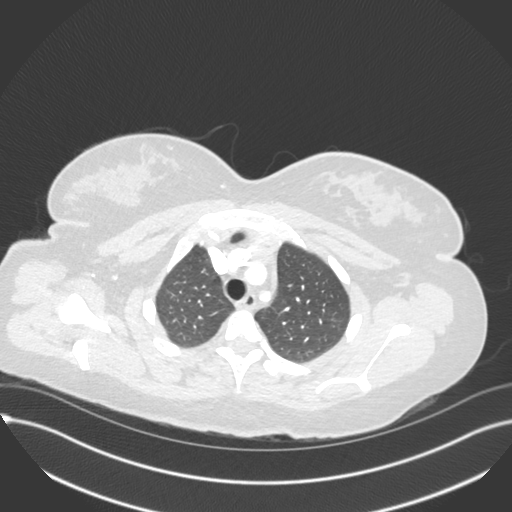
[im 221/285  soft-tissue]
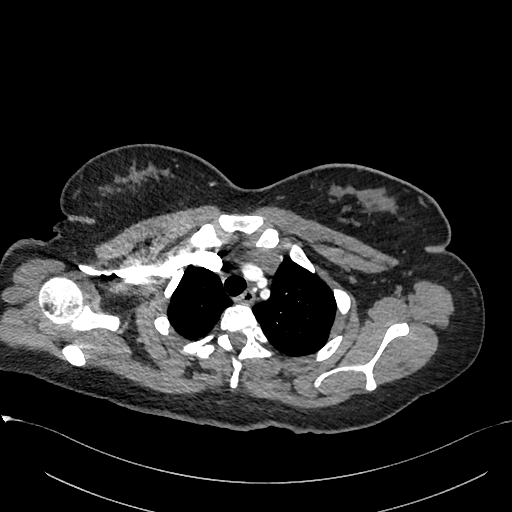
[im 253/285  lung]
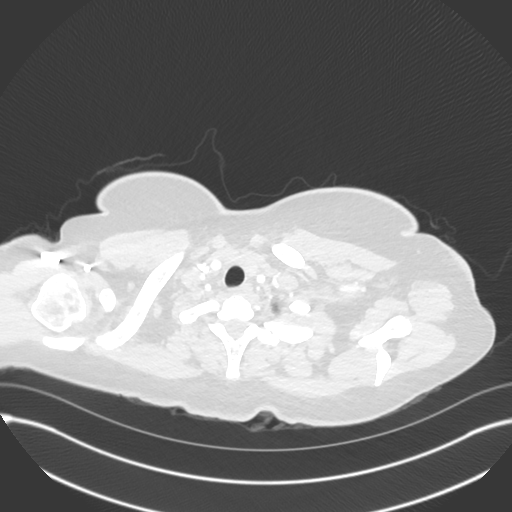
[im 269/285  soft-tissue]
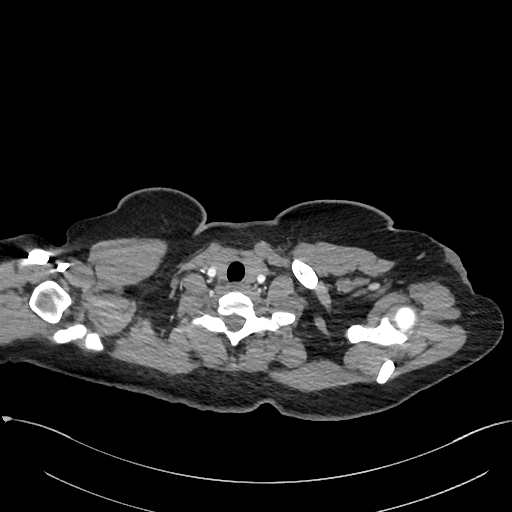

[Series 8: cor · coronal · 0.49mm/px · 3 of 145 slices shown]
[im 37/145  soft-tissue]
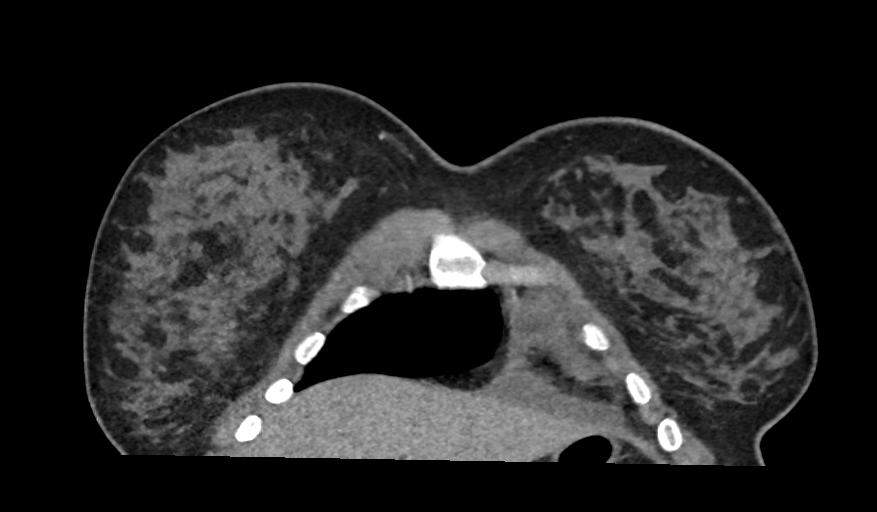
[im 73/145  soft-tissue]
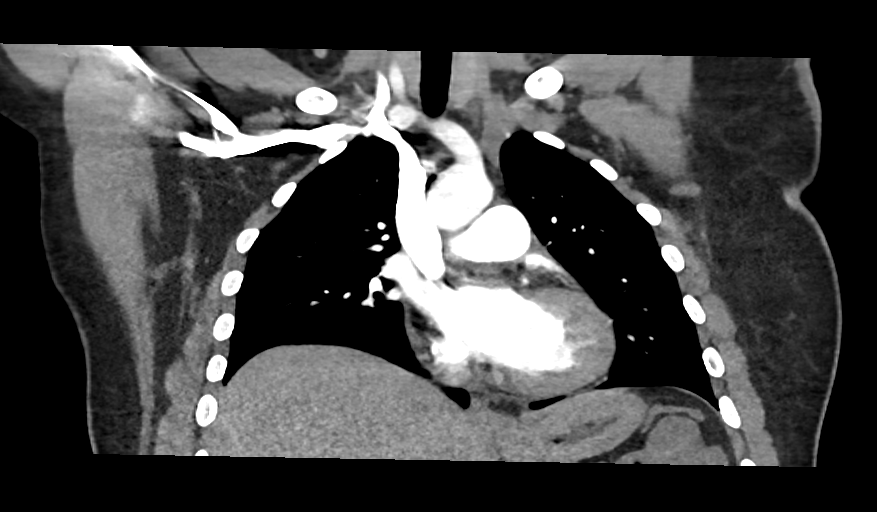
[im 109/145  soft-tissue]
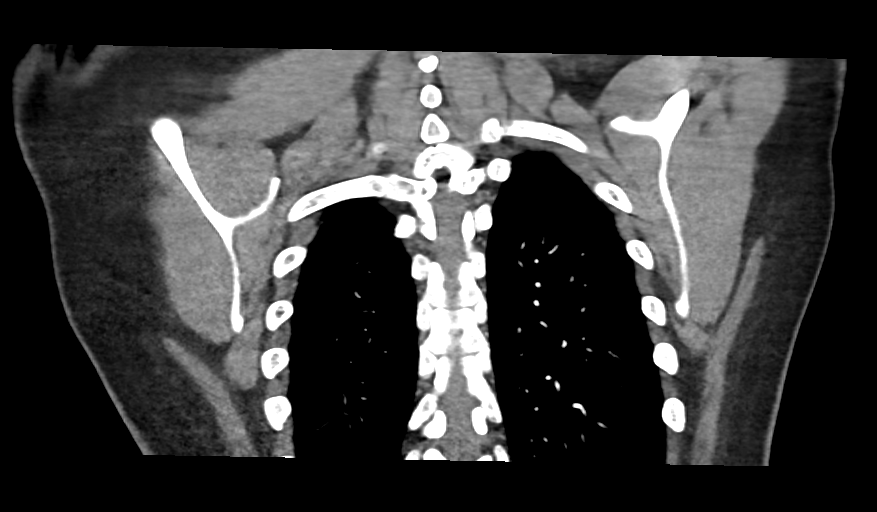

[17 of 46 positions shown; findings below may reference images not displayed]

FINDINGS: Cardiovascular: Pulmonary arteries are very well opacified to the
subsegmental level. There is no evidence of pulmonary embolism.
Central pulmonary arteries are normal in caliber. The thoracic aorta
is also fairly well opacified and demonstrates normal caliber
without evidence of dissection, atherosclerosis or aneurysmal
disease. Bovine branching anatomy of the proximal great vessels with
low proximal origin of the left vertebral artery off of the proximal
left subclavian artery.

The heart size is normal. Trace pericardial fluid at the anterior
base of the heart which does not appear significant. No calcified
coronary artery plaque visualized.

Mediastinum/Nodes: No enlarged mediastinal, hilar, or axillary lymph
nodes. Thyroid gland, trachea, and esophagus demonstrate no
significant findings.

Lungs/Pleura: There is no evidence of pulmonary edema,
consolidation, pneumothorax, nodule or pleural fluid.

Upper Abdomen: No acute abnormality.

Musculoskeletal: No chest wall abnormality. No acute or significant
osseous findings.

Review of the MIP images confirms the above findings.
IMPRESSION: Normal CTA of the chest. No evidence of pulmonary embolism or other
acute findings. Trace amount of pericardial fluid does not appears
significant.
